# Patient Record
Sex: Female | Born: 1995
Health system: Southern US, Community
[De-identification: ages and names within clinical notes are randomized; demographics above are authoritative.]

## PROBLEM LIST (undated history)

## (undated) ENCOUNTER — Inpatient Hospital Stay (HOSPITAL_COMMUNITY): Payer: Self-pay

---
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
## 2014-06-02 ENCOUNTER — Ambulatory Visit: Payer: Self-pay | Admitting: Internal Medicine

## 2015-05-16 ENCOUNTER — Ambulatory Visit (INDEPENDENT_AMBULATORY_CARE_PROVIDER_SITE_OTHER): Payer: 59 | Admitting: Family Medicine

## 2015-05-16 VITALS — BP 102/60 | HR 71 | Temp 98.1°F | Resp 17 | Ht 67.0 in | Wt 149.0 lb

## 2015-05-16 DIAGNOSIS — N898 Other specified noninflammatory disorders of vagina: Secondary | ICD-10-CM | POA: Diagnosis not present

## 2015-05-16 DIAGNOSIS — N3090 Cystitis, unspecified without hematuria: Secondary | ICD-10-CM

## 2015-05-16 DIAGNOSIS — R300 Dysuria: Secondary | ICD-10-CM | POA: Diagnosis not present

## 2015-05-16 DIAGNOSIS — Z113 Encounter for screening for infections with a predominantly sexual mode of transmission: Secondary | ICD-10-CM | POA: Diagnosis not present

## 2015-05-16 LAB — POCT WET + KOH PREP
Trich by wet prep: ABSENT
YEAST BY KOH: ABSENT
Yeast by wet prep: ABSENT

## 2015-05-16 LAB — POCT URINALYSIS DIP (MANUAL ENTRY)
BILIRUBIN UA: NEGATIVE
GLUCOSE UA: NEGATIVE
Ketones, POC UA: NEGATIVE
NITRITE UA: NEGATIVE
Protein Ur, POC: NEGATIVE
SPEC GRAV UA: 1.02
UROBILINOGEN UA: 0.2
pH, UA: 7

## 2015-05-16 LAB — POC MICROSCOPIC URINALYSIS (UMFC): MUCUS RE: ABSENT

## 2015-05-16 LAB — HIV ANTIBODY (ROUTINE TESTING W REFLEX): HIV: NONREACTIVE

## 2015-05-16 MED ORDER — NITROFURANTOIN MONOHYD MACRO 100 MG PO CAPS: 100.0000 mg | ORAL_CAPSULE | Freq: Two times a day (BID) | ORAL | Status: DC

## 2015-05-16 NOTE — Progress Notes (Signed)
Subjective:    Patient ID: Ashley Fletcher, female    DOB: [birth year], 20 y.o.   MRN: [id number]  HPI This is a pleasant 20 yo female who presents today with 8 month history of intermittent vaginal irritation. She has not had any discharge or noticed any rash. She is sexually active with her boyfriend of 1 year. Her symptoms started before they became sexually active. [place] lives in [place] and they see each other rarely. She used Plan B following intercourse. She expects that she and her boyfriend will get engaged in the coming months and she will probably go on OCPs at that time. She is Muslim and it is against her religion to have sex outside of marriage. Her boyfriend is Muslim as well. She is from the [place] originally and was there visiting last spring. She though she had a UTI and saw a doctor there who prescribed her medication. She had relief of urinary symptoms, but continued to have vaginal irritation.   No past medical history on file. No past surgical history on file. Family History  Problem Relation Age of Onset  . Hypertension Mother   . Diabetes Father   . Hyperlipidemia Father   . Hypertension Father    Social History  Substance Use Topics  . Smoking status: Never Smoker   . Smokeless tobacco: None  . Alcohol Use: No    Review of Systems Per HPI    Objective:   Physical Exam  Constitutional: She is oriented to person, place, and time. She appears well-developed and well-nourished.  HENT:  Head: Normocephalic and atraumatic.  Eyes: Conjunctivae are normal.  Cardiovascular: Normal rate.   Pulmonary/Chest: Effort normal.  Genitourinary: Vagina normal. No vaginal discharge found.  Musculoskeletal: Normal range of motion.  Neurological: She is alert and oriented to person, place, and time.  Skin: Skin is warm and dry.  Psychiatric: She has a normal mood and affect. Her behavior is normal. Judgment and thought content normal.  Vitals reviewed. BP 102/60 mmHg  Pulse 71   Temp(Src) 98.1 F (36.7 C) (Oral)  Resp 17  Ht  (1.702 m)  Wt 149 lb (67.586 kg)  BMI 23.33 kg/m2  SpO2 95%  LMP 05/09/2015 Results for orders placed or performed in visit on 05/16/15  POCT urinalysis dipstick  Result Value Ref Range   Color, UA yellow yellow   Clarity, UA clear clear   Glucose, UA negative negative   Bilirubin, UA negative negative   Ketones, POC UA negative negative   Spec Grav, UA 1.020    Blood, UA trace-intact (A) negative   pH, UA 7.0    Protein Ur, POC negative negative   Urobilinogen, UA 0.2    Nitrite, UA Negative Negative   Leukocytes, UA Trace (A) Negative  POCT Microscopic Urinalysis ([facility])  Result Value Ref Range   WBC,UR,HPF,POC Few (A) None WBC/hpf   RBC,UR,HPF,POC None None RBC/hpf   Bacteria Moderate (A) None, Too numerous to count   Mucus Absent Absent   Epithelial Cells, UR Per Microscopy Moderate (A) None, Too numerous to count cells/hpf  POCT Wet + KOH Prep  Result Value Ref Range   Yeast by KOH Absent Present, Absent   Yeast by wet prep Absent Present, Absent   WBC by wet prep Few None, Few, Too numerous to count   Clue Cells Wet Prep HPF POC None None, Too numerous to count   Trich by wet prep Absent Present, Absent   Bacteria Wet Prep HPF  POC Few None, Few, Too numerous to count   Epithelial Cells By [employer] Pref ([facility]) Moderate (A) None, Few, Too numerous to count   RBC,UR,HPF,POC None None RBC/hpf       Assessment & Plan:  1. Dysuria - POCT urinalysis dipstick - POCT Microscopic Urinalysis ([facility])  2. Vaginal irritation - POCT Wet + KOH Prep  3. Screening for STDs (sexually transmitted diseases) - GC/Chlamydia Probe Amp - HIV antibody - RPR  4. Cystitis - nitrofurantoin, macrocrystal-monohydrate, (MACROBID) 100 MG capsule; Take 1 capsule (100 mg total) by mouth 2 (two) times daily.  Dispense: 14 capsule; Refill: 0   Olean Ree, FNP-BC  [facility]  05/16/2015  2:26 PM

## 2015-05-16 NOTE — Progress Notes (Signed)
   Subjective:    Patient ID: Ashley Fletcher, female    DOB: 03-04-1996, 20 y.o.   MRN: [id number]  HPI This is a pleasant 20 years old that presents today with itching and burning to her vaginal region since last Ashley Fletcher . Pt reports that last Ashley Fletcher she was in [place] and believed she had a UTI (burning and itching). She went to see a doctor who gave her some medication that relieved the burning of her urethra but not her vagina. Patient states that she was sexual active last week and felt a burning sensation when she went to urinate. She states that since last Wednesday (2/22) urinating has been very painful. She tried Azo last September- no relief. Also reports that these symptoms are intermittent. Also reports that she tried changing her clothing, detergent, and underwear with no relief. Denies blood, discharge,or foul smell.    No past medical history on file. Family History  Problem Relation Age of Onset  . Hypertension Mother   . Diabetes Father   . Hyperlipidemia Father   . Hypertension Father    Social History   Social History  . Marital Status: Single    Spouse Name: N/A  . Number of Children: N/A  . Years of Education: N/A   Occupational History  . Not on file.   Social History Main Topics  . Smoking status: Never Smoker   . Smokeless tobacco: Not on file  . Alcohol Use: No  . Drug Use: No  . Sexual Activity: Not on file   Other Topics Concern  . Not on file   Social History Narrative     Review of Systems  Constitutional: Negative for activity change and appetite change.  HENT: Negative for rhinorrhea, sinus pressure and sneezing.   Respiratory: Negative for cough, chest tightness and shortness of breath.   Cardiovascular: Negative for chest pain.  Gastrointestinal: Negative for nausea, vomiting, abdominal pain and diarrhea.  Genitourinary: Positive for dysuria and dyspareunia. Negative for urgency, hematuria, flank pain and vaginal bleeding.  Musculoskeletal: Negative  for back pain.  Skin: Negative for rash.       Objective:   Physical Exam  Constitutional: She is oriented to person, place, and time. She appears well-developed and well-nourished.  HENT:  Head: Normocephalic.  Genitourinary: No vaginal discharge found.  Pink in color, no foul smell  Musculoskeletal: Normal range of motion.  Neurological: She is alert and oriented to person, place, and time.  Skin: Skin is warm and dry. No rash noted.  Psychiatric: She has a normal mood and affect. Her behavior is normal. Judgment and thought content normal.    BP 102/60 mmHg  Pulse 71  Temp(Src) 98.1 F (36.7 C) (Oral)  Resp 17  Ht  (1.702 m)  Wt 149 lb (67.586 kg)  BMI 23.33 kg/m2  SpO2 95%  LMP 05/09/2015       Assessment & Plan:

## 2015-05-16 NOTE — Patient Instructions (Signed)

## 2015-05-17 LAB — RPR

## 2015-05-17 LAB — GC/CHLAMYDIA PROBE AMP
CT PROBE, AMP APTIMA: NOT DETECTED
GC Probe RNA: NOT DETECTED

## 2015-07-05 ENCOUNTER — Encounter: Payer: Self-pay | Admitting: Family Medicine

## 2015-08-20 ENCOUNTER — Ambulatory Visit: Payer: Self-pay | Attending: Internal Medicine

## 2017-12-03 ENCOUNTER — Ambulatory Visit: Payer: Self-pay | Attending: Family Medicine | Admitting: Family Medicine

## 2017-12-03 ENCOUNTER — Other Ambulatory Visit: Payer: Self-pay

## 2017-12-03 ENCOUNTER — Encounter: Payer: Self-pay | Admitting: Family Medicine

## 2017-12-03 VITALS — BP 131/88 | HR 76 | Temp 98.8°F | Resp 18 | Ht 67.0 in | Wt 165.4 lb

## 2017-12-03 DIAGNOSIS — R072 Precordial pain: Secondary | ICD-10-CM

## 2017-12-03 DIAGNOSIS — R300 Dysuria: Secondary | ICD-10-CM

## 2017-12-03 DIAGNOSIS — R82998 Other abnormal findings in urine: Secondary | ICD-10-CM

## 2017-12-03 DIAGNOSIS — R0789 Other chest pain: Secondary | ICD-10-CM | POA: Insufficient documentation

## 2017-12-03 DIAGNOSIS — R5383 Other fatigue: Secondary | ICD-10-CM

## 2017-12-03 LAB — POCT URINALYSIS DIP (CLINITEK)
Bilirubin, UA: NEGATIVE
Glucose, UA: NEGATIVE mg/dL
Ketones, POC UA: NEGATIVE mg/dL
Nitrite, UA: NEGATIVE
POC,PROTEIN,UA: NEGATIVE
Spec Grav, UA: 1.015
Urobilinogen, UA: 0.2 U/dL
pH, UA: 7

## 2017-12-03 MED ORDER — OMEPRAZOLE 20 MG PO CPDR: 20.0000 mg | DELAYED_RELEASE_CAPSULE | Freq: Every day | ORAL | 3 refills | Status: DC

## 2017-12-03 NOTE — Progress Notes (Signed)
Flu shot: no  Pain: 0

## 2017-12-04 LAB — CBC WITH DIFFERENTIAL/PLATELET
Basophils Absolute: 0 x10E3/uL (ref 0.0–0.2)
Basos: 1 %
EOS (ABSOLUTE): 0.2 x10E3/uL (ref 0.0–0.4)
Eos: 3 %
Hematocrit: 37.9 % (ref 34.0–46.6)
Hemoglobin: 12.3 g/dL (ref 11.1–15.9)
Immature Grans (Abs): 0 x10E3/uL (ref 0.0–0.1)
Immature Granulocytes: 0 %
Lymphocytes Absolute: 2.4 x10E3/uL (ref 0.7–3.1)
Lymphs: 36 %
MCH: 26.2 pg — ABNORMAL LOW (ref 26.6–33.0)
MCHC: 32.5 g/dL (ref 31.5–35.7)
MCV: 81 fL (ref 79–97)
Monocytes Absolute: 0.7 x10E3/uL (ref 0.1–0.9)
Monocytes: 10 %
Neutrophils Absolute: 3.3 x10E3/uL (ref 1.4–7.0)
Neutrophils: 50 %
Platelets: 304 x10E3/uL (ref 150–450)
RBC: 4.7 x10E6/uL (ref 3.77–5.28)
RDW: 14.8 % (ref 12.3–15.4)
WBC: 6.6 x10E3/uL (ref 3.4–10.8)

## 2017-12-04 LAB — BASIC METABOLIC PANEL WITH GFR
BUN/Creatinine Ratio: 11 (ref 9–23)
BUN: 8 mg/dL (ref 6–20)
CO2: 24 mmol/L (ref 20–29)
Calcium: 9.5 mg/dL (ref 8.7–10.2)
Chloride: 101 mmol/L (ref 96–106)
Creatinine, Ser: 0.76 mg/dL (ref 0.57–1.00)
GFR calc Af Amer: 128 mL/min/1.73
GFR calc non Af Amer: 111 mL/min/1.73
Glucose: 86 mg/dL (ref 65–99)
Potassium: 3.8 mmol/L (ref 3.5–5.2)
Sodium: 140 mmol/L (ref 134–144)

## 2017-12-04 LAB — TSH: TSH: 1.6 u[IU]/mL (ref 0.450–4.500)

## 2017-12-04 MED FILL — OMEPRAZOLE 20 MG CAP: 20 | 30 days supply | Qty: 30 | Fill #0

## 2017-12-05 LAB — URINE CULTURE

## 2017-12-06 NOTE — Progress Notes (Signed)
Subjective:    Patient ID: Ashley Fletcher, female    DOB: [birth year], 22 y.o.   MRN: [id number]  HPI 22 year old female who is new to the practice and presents to establish care.  Patient states that she is currently on her menses but she has noticed that she has some burning/discomfort with urination and believes she Katreena have a urinary tract infection.  Patient denies any fever or chills.  Patient is sexually active.  Patient denies any pelvic, abdominal vaginal pain and has seen no vaginal discharge.  Patient also with complaint of noticing a recent increase in fatigue.  Patient believes that the fatigue has increased over the past several months.  Patient reports no significant past medical history.  Patient reports family history of mother with hypertension and father with hypertension, hyperlipidemia and diabetes.  Patient has never smoked cigarettes and does not drink alcohol.  Patient denies any prior surgeries.    Review of Systems  Constitutional: Positive for fatigue. Negative for chills and fever.  Respiratory: Negative for cough and shortness of breath.   Cardiovascular: Positive for chest pain (Patient gets occasional discomfort/burning sensation in the upper middle portion of her chest). Negative for palpitations and leg swelling.  Gastrointestinal: Positive for nausea (Occasional mild nausea, burping/belching and bad tasting liquid in her mouth/bad breath). Negative for abdominal pain.  Genitourinary: Positive for dysuria. Negative for frequency.  Musculoskeletal: Negative for back pain, gait problem and joint swelling.  Neurological: Negative for dizziness and headaches.       Objective:   Physical Exam BP 131/88   Pulse 76   Temp 98.8 F (37.1 C) (Oral)   Resp 18   Ht 5\' 7"  (1.702 m)   Wt 165 lb 6.4 oz (75 kg)   LMP 12/03/2017   SpO2 99%   BMI 25.91 kg/m  Vital signs and nurse's note reviewed General-well-nourished, well-developed young adult female in no acute  distress Neck-supple, no lymphadenopathy, no thyromegaly Lungs-clear to auscultation bilaterally Cardiovascular-regular rate and rhythm Abdomen-soft no reproducible epigastric or suprapubic tenderness on exam Back-no CVA tenderness Extremities-no edema      Assessment & Plan:  1. Substernal chest pain On review of systems, complaint occasional substernal chest pain as well as often having a bad taste in the mouth in the mornings and patient has also had burping/belching, occasional nausea and occasional backwash of bad tasting fluid into her mouth.  Discussed with the patient that her symptoms are consistent with gastroesophageal reflux.  Patient will be placed on omeprazole.  Patient should avoid late night eating as well as avoidance of trigger foods.  Patient should return to clinic if she has continued episodes of chest discomfort or any concerns.  Patient will also have CBC.  Due to patient's age and lack of any other symptoms associated with her substernal chest pain such as diaphoresis or radiation of pain to the back, neck or left arm, there is a low probability that chest is cardiac in nature and therefore no EKG done at today's visit - CBC with Differential - omeprazole (PRILOSEC) 20 MG capsule; Take 1 capsule (20 mg total) by mouth daily.  Dispense: 30 capsule; Refill: 3  2. Dysuria Patient with complaint of dysuria and urinalysis was done to look for possible urinary tract infection - POCT URINALYSIS DIP (CLINITEK)  3. Fatigue, unspecified type Patient with complaint of recent onset of fatigue.  Pregnancy test was not done as patient is currently on her regular menstrual cycle which most likely indicates  that she is not currently pregnant.  Patient will have CBC, BMP and TSH to look for anemia, electrolyte abnormality/elevated glucose or thyroid disorder in follow-up of her fatigue as well as urinalysis to look for possible urinary tract infection as patient also has dysuria. - POCT  URINALYSIS DIP (CLINITEK) - CBC with Differential - Basic Metabolic Panel - TSH  4. Leukocytes in urine Patient with leukocytes in the urine and urine will be sent for culture.  Patient will be notified if further treatment is needed based on culture results. - Urine Culture  *Influenza immunization offered but declined at today's visit  An After Visit Summary was printed and given to the patient.  Return in about 6 weeks (around 01/14/2018).

## 2017-12-07 ENCOUNTER — Telehealth: Payer: Self-pay

## 2017-12-07 NOTE — Telephone Encounter (Signed)
Contacted pt to go over lab and urine results pt is aware  Dr. Jillyn HiddenFulp pt is wanting to know since there is no treatment needed what does she need to do for the symptoms. Pt states she informed you that her symptoms starts after her period

## 2017-12-09 NOTE — Telephone Encounter (Signed)
Pt is aware of Dr. Jillyn HiddenFulp response pt doesn't have any questions or concerns

## 2018-01-14 ENCOUNTER — Ambulatory Visit: Payer: Self-pay | Admitting: Family Medicine

## 2018-02-16 ENCOUNTER — Ambulatory Visit: Payer: Self-pay | Admitting: Family Medicine

## 2018-12-06 DIAGNOSIS — Z118 Encounter for screening for other infectious and parasitic diseases: Secondary | ICD-10-CM | POA: Diagnosis not present

## 2018-12-06 DIAGNOSIS — N760 Acute vaginitis: Secondary | ICD-10-CM | POA: Diagnosis not present

## 2018-12-06 DIAGNOSIS — Z113 Encounter for screening for infections with a predominantly sexual mode of transmission: Secondary | ICD-10-CM | POA: Diagnosis not present

## 2018-12-22 DIAGNOSIS — Z01419 Encounter for gynecological examination (general) (routine) without abnormal findings: Secondary | ICD-10-CM | POA: Diagnosis not present

## 2018-12-22 DIAGNOSIS — Z6825 Body mass index (BMI) 25.0-25.9, adult: Secondary | ICD-10-CM | POA: Diagnosis not present

## 2019-01-20 ENCOUNTER — Other Ambulatory Visit: Payer: Self-pay

## 2019-01-20 DIAGNOSIS — B373 Candidiasis of vulva and vagina: Secondary | ICD-10-CM | POA: Diagnosis not present

## 2019-01-20 DIAGNOSIS — Z20822 Contact with and (suspected) exposure to covid-19: Secondary | ICD-10-CM

## 2019-01-20 DIAGNOSIS — N898 Other specified noninflammatory disorders of vagina: Secondary | ICD-10-CM | POA: Diagnosis not present

## 2019-01-20 DIAGNOSIS — R300 Dysuria: Secondary | ICD-10-CM | POA: Diagnosis not present

## 2019-01-22 LAB — NOVEL CORONAVIRUS, NAA: SARS-CoV-2, NAA: NOT DETECTED

## 2019-02-09 ENCOUNTER — Other Ambulatory Visit: Payer: Self-pay

## 2019-02-09 DIAGNOSIS — Z20822 Contact with and (suspected) exposure to covid-19: Secondary | ICD-10-CM

## 2019-02-10 ENCOUNTER — Other Ambulatory Visit: Payer: Self-pay

## 2019-02-10 ENCOUNTER — Emergency Department (HOSPITAL_COMMUNITY): Payer: BC Managed Care – PPO

## 2019-02-10 ENCOUNTER — Emergency Department (HOSPITAL_COMMUNITY)
Admission: EM | Admit: 2019-02-10 | Discharge: 2019-02-10 | Disposition: A | Discharge status: Home / Self Care | Payer: BC Managed Care – PPO | Attending: Emergency Medicine | Admitting: Emergency Medicine

## 2019-02-10 ENCOUNTER — Encounter (HOSPITAL_COMMUNITY): Payer: Self-pay | Admitting: Emergency Medicine

## 2019-02-10 DIAGNOSIS — U071 COVID-19: Secondary | ICD-10-CM | POA: Insufficient documentation

## 2019-02-10 DIAGNOSIS — Z79899 Other long term (current) drug therapy: Secondary | ICD-10-CM | POA: Insufficient documentation

## 2019-02-10 DIAGNOSIS — R0602 Shortness of breath: Secondary | ICD-10-CM | POA: Diagnosis not present

## 2019-02-10 DIAGNOSIS — R079 Chest pain, unspecified: Secondary | ICD-10-CM | POA: Diagnosis not present

## 2019-02-10 LAB — URINALYSIS, ROUTINE W REFLEX MICROSCOPIC
Bacteria, UA: NONE SEEN
Bilirubin Urine: NEGATIVE
Glucose, UA: NEGATIVE mg/dL
Ketones, ur: NEGATIVE mg/dL
Leukocytes,Ua: NEGATIVE
Nitrite: NEGATIVE
Protein, ur: NEGATIVE mg/dL
Specific Gravity, Urine: 1.004 — ABNORMAL LOW (ref 1.005–1.030)
pH: 6 (ref 5.0–8.0)

## 2019-02-10 LAB — CBC
HCT: 36 % (ref 36.0–46.0)
Hemoglobin: 11.5 g/dL — ABNORMAL LOW (ref 12.0–15.0)
MCH: 26.6 pg (ref 26.0–34.0)
MCHC: 31.9 g/dL (ref 30.0–36.0)
MCV: 83.1 fL (ref 80.0–100.0)
Platelets: 192 10*3/uL (ref 150–400)
RBC: 4.33 MIL/uL (ref 3.87–5.11)
RDW: 15.8 % — ABNORMAL HIGH (ref 11.5–15.5)
WBC: 3.1 10*3/uL — ABNORMAL LOW (ref 4.0–10.5)
nRBC: 0 % (ref 0.0–0.2)

## 2019-02-10 LAB — COMPREHENSIVE METABOLIC PANEL
ALT: 23 U/L (ref 0–44)
AST: 19 U/L (ref 15–41)
Albumin: 3.4 g/dL — ABNORMAL LOW (ref 3.5–5.0)
Alkaline Phosphatase: 64 U/L (ref 38–126)
Anion gap: 7 (ref 5–15)
BUN: 12 mg/dL (ref 6–20)
CO2: 21 mmol/L — ABNORMAL LOW (ref 22–32)
Calcium: 8.7 mg/dL — ABNORMAL LOW (ref 8.9–10.3)
Chloride: 110 mmol/L (ref 98–111)
Creatinine, Ser: 0.57 mg/dL (ref 0.44–1.00)
GFR calc Af Amer: >60 mL/min (ref >60)
GFR calc non Af Amer: >60 mL/min (ref >60)
Glucose, Bld: 94 mg/dL (ref 70–99)
Potassium: 3.5 mmol/L (ref 3.5–5.1)
Sodium: 138 mmol/L (ref 135–145)
Total Bilirubin: 0.3 mg/dL (ref 0.3–1.2)
Total Protein: 6.8 g/dL (ref 6.5–8.1)

## 2019-02-10 LAB — POC SARS CORONAVIRUS 2 AG -  ED: SARS Coronavirus 2 Ag: POSITIVE — AB

## 2019-02-10 LAB — TROPONIN I (HIGH SENSITIVITY): Troponin I (High Sensitivity): 2 ng/L (ref <18)

## 2019-02-10 LAB — I-STAT BETA HCG BLOOD, ED (MC, WL, AP ONLY): I-stat hCG, quantitative: <5 m[IU]/mL (ref <5)

## 2019-02-10 LAB — NOVEL CORONAVIRUS, NAA: SARS-CoV-2, NAA: DETECTED — AB

## 2019-02-10 LAB — LIPASE, BLOOD: Lipase: 30 U/L (ref 11–51)

## 2019-02-10 MED ORDER — SODIUM CHLORIDE 0.9% FLUSH: 3.0000 mL | Freq: Once | INTRAVENOUS | Status: DC

## 2019-02-10 MED ORDER — SODIUM CHLORIDE 0.9% FLUSH
3.0000 mL | Freq: Once | INTRAVENOUS | Status: AC
  Administered 2019-02-10: 08:00:00 3 mL via INTRAVENOUS

## 2019-02-10 MED ORDER — HYDROCODONE-ACETAMINOPHEN 5-325 MG PO TABS
1.0000 | ORAL_TABLET | Freq: Once | ORAL | Status: AC
  Administered 2019-02-10: 1 via ORAL
  Filled 2019-02-10: qty 1

## 2019-02-10 NOTE — Discharge Instructions (Addendum)
Tylenol for aches. Warm compresses help muscle aching too.   Please return to the emergency department with any significant pain or shortness of breath.
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
## 2019-02-10 NOTE — ED Notes (Signed)
Pt verbalizes understanding of DC instructions. Pt belongings returned and is ambulatory out of ED.  

## 2019-02-10 NOTE — ED Triage Notes (Signed)
Patient complaining of mid chest pain and upper abdominal pain. Patient states this started around an hour ago. Patient is complaining of headache and fatigue.

## 2019-03-08 DIAGNOSIS — Z3200 Encounter for pregnancy test, result unknown: Secondary | ICD-10-CM | POA: Diagnosis not present

## 2019-03-18 NOTE — L&D Delivery Note (Signed)
Delivery Note:   G1P0 at [redacted]w[redacted]d  Admitting diagnosis: Normal labor [O80, Z37.9] Risks: None Onset of labor: 02/03/2020 @ 1625 IOL/Augmentation: Pitocin ROM: 02/03/2020 @ 0200, clear fluid  Complete dilation at 02/03/2020  2310 Onset of pushing at 2310 FHR second stage Cat I  Analgesia /Anesthesia intrapartum:Epidural  Pushing in lithotomy and side lying position with CNM and L&D staff support, Mustafa, FOB, and mother, Deatra James, present for birth and supportive.  Delivery of a Live born female  Birth Weight:  3015g, 6lb 10.4oz APGAR: 7, 9  Newborn Delivery   Birth date/time: 02/03/2020 23:42:00 Delivery type: Vaginal, Spontaneous     in cephalic presentation, position OA to LOA.  APGAR:1 min-7 , 5 min-9   Nuchal Cord: Yes  Cord double clamped after cessation of pulsation, cut by CNM.  Collection of cord blood for typing completed. Cord blood donation-None  Arterial cord blood sample-No    Placenta delivered-Spontaneous  with 3 vessels . Uterotonics: Pitocin Placenta to L&D Uterine tone firm  Bleeding stable  3rd degree  laceration identified.  Dr. Conni Elliot called to the bedside for assessment and repair of third degree laceration.  Episiotomy:None   Repair: See note by Dr. Ines Bloomer. Blood Loss (mL):150.00   Complications: None  Mom to postpartum.  Baby [name] to Couplet care / Skin to Skin.  Delivery Report:   Review the Delivery Report for details.    June Leap, CNM, MSN 02/04/2020, 1:22 AM

## 2019-04-05 DIAGNOSIS — G43109 Migraine with aura, not intractable, without status migrainosus: Secondary | ICD-10-CM | POA: Diagnosis not present

## 2019-04-05 DIAGNOSIS — R110 Nausea: Secondary | ICD-10-CM | POA: Diagnosis not present

## 2019-04-08 DIAGNOSIS — G44209 Tension-type headache, unspecified, not intractable: Secondary | ICD-10-CM | POA: Diagnosis not present

## 2019-04-08 DIAGNOSIS — R0981 Nasal congestion: Secondary | ICD-10-CM | POA: Diagnosis not present

## 2019-04-08 DIAGNOSIS — R05 Cough: Secondary | ICD-10-CM | POA: Diagnosis not present

## 2019-04-08 DIAGNOSIS — R0602 Shortness of breath: Secondary | ICD-10-CM | POA: Diagnosis not present

## 2019-04-10 DIAGNOSIS — R0602 Shortness of breath: Secondary | ICD-10-CM | POA: Diagnosis not present

## 2019-04-10 DIAGNOSIS — R05 Cough: Secondary | ICD-10-CM | POA: Diagnosis not present

## 2019-04-10 DIAGNOSIS — R0981 Nasal congestion: Secondary | ICD-10-CM | POA: Diagnosis not present

## 2019-04-10 DIAGNOSIS — G44209 Tension-type headache, unspecified, not intractable: Secondary | ICD-10-CM | POA: Diagnosis not present

## 2019-04-12 DIAGNOSIS — R7989 Other specified abnormal findings of blood chemistry: Secondary | ICD-10-CM | POA: Diagnosis not present

## 2019-04-12 DIAGNOSIS — R05 Cough: Secondary | ICD-10-CM | POA: Diagnosis not present

## 2019-04-12 DIAGNOSIS — R0602 Shortness of breath: Secondary | ICD-10-CM | POA: Diagnosis not present

## 2019-05-10 DIAGNOSIS — R102 Pelvic and perineal pain: Secondary | ICD-10-CM | POA: Diagnosis not present

## 2019-05-10 DIAGNOSIS — N760 Acute vaginitis: Secondary | ICD-10-CM | POA: Diagnosis not present

## 2019-05-24 DIAGNOSIS — Z3689 Encounter for other specified antenatal screening: Secondary | ICD-10-CM | POA: Diagnosis not present

## 2019-05-24 DIAGNOSIS — Z3200 Encounter for pregnancy test, result unknown: Secondary | ICD-10-CM | POA: Diagnosis not present

## 2019-05-30 ENCOUNTER — Other Ambulatory Visit: Payer: Self-pay

## 2019-05-30 ENCOUNTER — Encounter (HOSPITAL_COMMUNITY): Payer: Self-pay | Admitting: Emergency Medicine

## 2019-05-30 ENCOUNTER — Emergency Department (HOSPITAL_COMMUNITY)
Admission: EM | Admit: 2019-05-30 | Discharge: 2019-05-31 | Disposition: A | Discharge status: Home / Self Care | Payer: BC Managed Care – PPO | Attending: Emergency Medicine | Admitting: Emergency Medicine

## 2019-05-30 DIAGNOSIS — Y9389 Activity, other specified: Secondary | ICD-10-CM | POA: Diagnosis not present

## 2019-05-30 DIAGNOSIS — Y999 Unspecified external cause status: Secondary | ICD-10-CM | POA: Diagnosis not present

## 2019-05-30 DIAGNOSIS — I10 Essential (primary) hypertension: Secondary | ICD-10-CM | POA: Diagnosis not present

## 2019-05-30 DIAGNOSIS — O9A211 Injury, poisoning and certain other consequences of external causes complicating pregnancy, first trimester: Secondary | ICD-10-CM | POA: Insufficient documentation

## 2019-05-30 DIAGNOSIS — M25551 Pain in right hip: Secondary | ICD-10-CM | POA: Diagnosis not present

## 2019-05-30 DIAGNOSIS — S0990XA Unspecified injury of head, initial encounter: Secondary | ICD-10-CM | POA: Insufficient documentation

## 2019-05-30 DIAGNOSIS — Y92410 Unspecified street and highway as the place of occurrence of the external cause: Secondary | ICD-10-CM | POA: Diagnosis not present

## 2019-05-30 DIAGNOSIS — R1030 Lower abdominal pain, unspecified: Secondary | ICD-10-CM

## 2019-05-30 DIAGNOSIS — Z3A01 Less than 8 weeks gestation of pregnancy: Secondary | ICD-10-CM | POA: Insufficient documentation

## 2019-05-30 DIAGNOSIS — R519 Headache, unspecified: Secondary | ICD-10-CM | POA: Insufficient documentation

## 2019-05-30 DIAGNOSIS — R1084 Generalized abdominal pain: Secondary | ICD-10-CM | POA: Diagnosis not present
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
## 2019-05-31 ENCOUNTER — Other Ambulatory Visit: Payer: Self-pay

## 2019-05-31 ENCOUNTER — Emergency Department (HOSPITAL_COMMUNITY): Payer: BC Managed Care – PPO

## 2019-05-31 DIAGNOSIS — R519 Headache, unspecified: Secondary | ICD-10-CM | POA: Diagnosis not present

## 2019-05-31 DIAGNOSIS — S0990XA Unspecified injury of head, initial encounter: Secondary | ICD-10-CM | POA: Diagnosis not present
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
## 2019-05-31 NOTE — ED Notes (Signed)
Pt went to restroom, did not give urine sample, EDP Jeraldine Loots okay
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
## 2019-06-01 DIAGNOSIS — M791 Myalgia, unspecified site: Secondary | ICD-10-CM | POA: Diagnosis not present

## 2019-06-01 DIAGNOSIS — G44209 Tension-type headache, unspecified, not intractable: Secondary | ICD-10-CM | POA: Diagnosis not present

## 2019-06-07 DIAGNOSIS — R109 Unspecified abdominal pain: Secondary | ICD-10-CM | POA: Diagnosis not present

## 2019-06-22 DIAGNOSIS — Z3201 Encounter for pregnancy test, result positive: Secondary | ICD-10-CM | POA: Diagnosis not present

## 2019-06-28 DIAGNOSIS — Z3401 Encounter for supervision of normal first pregnancy, first trimester: Secondary | ICD-10-CM | POA: Diagnosis not present

## 2019-06-28 DIAGNOSIS — Z789 Other specified health status: Secondary | ICD-10-CM | POA: Diagnosis not present

## 2019-06-28 DIAGNOSIS — Z3689 Encounter for other specified antenatal screening: Secondary | ICD-10-CM | POA: Diagnosis not present

## 2019-07-12 DIAGNOSIS — Z3401 Encounter for supervision of normal first pregnancy, first trimester: Secondary | ICD-10-CM | POA: Diagnosis not present

## 2019-07-12 DIAGNOSIS — Z113 Encounter for screening for infections with a predominantly sexual mode of transmission: Secondary | ICD-10-CM | POA: Diagnosis not present

## 2019-08-09 DIAGNOSIS — Z3402 Encounter for supervision of normal first pregnancy, second trimester: Secondary | ICD-10-CM | POA: Diagnosis not present

## 2019-09-23 DIAGNOSIS — Z3A20 20 weeks gestation of pregnancy: Secondary | ICD-10-CM | POA: Diagnosis not present

## 2019-09-23 DIAGNOSIS — O353XX0 Maternal care for (suspected) damage to fetus from viral disease in mother, not applicable or unspecified: Secondary | ICD-10-CM | POA: Diagnosis not present

## 2019-11-18 DIAGNOSIS — Z3689 Encounter for other specified antenatal screening: Secondary | ICD-10-CM | POA: Diagnosis not present

## 2019-11-29 DIAGNOSIS — O3663X0 Maternal care for excessive fetal growth, third trimester, not applicable or unspecified: Secondary | ICD-10-CM | POA: Diagnosis not present

## 2019-11-29 DIAGNOSIS — Z3A31 31 weeks gestation of pregnancy: Secondary | ICD-10-CM | POA: Diagnosis not present

## 2019-12-08 ENCOUNTER — Encounter (HOSPITAL_COMMUNITY): Payer: Self-pay | Admitting: Obstetrics and Gynecology

## 2019-12-08 ENCOUNTER — Other Ambulatory Visit: Payer: Self-pay

## 2019-12-08 ENCOUNTER — Inpatient Hospital Stay (HOSPITAL_COMMUNITY)
Admission: AD | Admit: 2019-12-08 | Discharge: 2019-12-09 | Disposition: A | Discharge status: Home / Self Care | Payer: BC Managed Care – PPO | Attending: Obstetrics | Admitting: Obstetrics

## 2019-12-08 DIAGNOSIS — R519 Headache, unspecified: Secondary | ICD-10-CM | POA: Insufficient documentation

## 2019-12-08 DIAGNOSIS — Z3A31 31 weeks gestation of pregnancy: Secondary | ICD-10-CM | POA: Insufficient documentation

## 2019-12-08 DIAGNOSIS — O26893 Other specified pregnancy related conditions, third trimester: Secondary | ICD-10-CM | POA: Insufficient documentation

## 2019-12-08 DIAGNOSIS — O4700 False labor before 37 completed weeks of gestation, unspecified trimester: Secondary | ICD-10-CM

## 2019-12-08 NOTE — MAU Note (Addendum)
Having abdominal cramps since last night. Felt like period cramps. Period cramps started back tonight and getting worse. Having pelvic pressure. Has headache tonight. When awoke today had "red dots around eyes and nose". Since Monday tongue is sore "like I have cuts all on it" Mostly came in because of abd cramping

## 2019-12-08 NOTE — MAU Provider Note (Addendum)
Chief Complaint:  Abdominal Pain and Headache   First Provider Initiated Contact with Patient 12/08/19 2359     HPI: Ashley Fletcher is a 24 y.o. G1P0 at 27w5dwho presents to maternity admissions reporting pelvic cramping.  Also c/o retroorbital headache. Has not taken anything. Has new small red dots around eyes/brow, no itching She reports good fetal movement, denies LOF, vaginal bleeding, vaginal itching/burning, urinary symptoms, h/a, dizziness, n/v, diarrhea, constipation or fever/chills.  She denies headache, visual changes or RUQ abdominal pain.  Abdominal Pain This is a new problem. The current episode started yesterday. The onset quality is gradual. The problem occurs intermittently. The problem has been unchanged. The pain is mild. The quality of the pain is cramping. The abdominal pain does not radiate. Associated symptoms include headaches. Pertinent negatives include no constipation, diarrhea, dysuria, fever or nausea. Nothing aggravates the pain. The pain is relieved by nothing. She has tried nothing for the symptoms.  Headache  This is a new problem. The current episode started today. The problem occurs constantly. The pain is located in the retro-orbital region. The pain does not radiate. The pain quality is not similar to prior headaches. The quality of the pain is described as aching. Associated symptoms include abdominal pain. Pertinent negatives include no coughing, dizziness, fever, muscle aches or nausea. Nothing aggravates the symptoms. She has tried nothing for the symptoms. The treatment provided no relief.    RN Note: Having abdominal cramps since last night. Felt like period cramps. Period cramps started back tonight and getting worse. Having pelvic pressure. Has headache tonight. When awoke today had "red dots around eyes and nose". Since Monday tongue is sore "like I have cuts all on it" Mostly came in because of abd cramping  Past Medical History: History reviewed. No  pertinent past medical history.  Past obstetric history: OB History  Gravida Para Term Preterm AB Living  1            SAB TAB Ectopic Multiple Live Births               # Outcome Date GA Lbr Len/2nd Weight Sex Delivery Anes PTL Lv  1 Current             Past Surgical History: History reviewed. No pertinent surgical history.  Family History: Family History  Problem Relation Age of Onset   Hypertension Mother    Diabetes Father    Hyperlipidemia Father    Hypertension Father     Social History: Social History   Tobacco Use   Smoking status: Never Smoker   Smokeless tobacco: Never Used  Building services engineer Use: Never used  Substance Use Topics   Alcohol use: No   Drug use: No    Allergies:  Allergies  Allergen Reactions   Peanut-Containing Drug Products Hives and Rash    Meds:  Medications Prior to Admission  Medication Sig Dispense Refill Last Dose   calcium carbonate (TUMS - DOSED IN MG ELEMENTAL CALCIUM) 500 MG chewable tablet Chew 1 tablet by mouth daily.   12/07/2019 at Unknown time   Prenatal Vit-Fe Fumarate-FA (PRENATAL MULTIVITAMIN) TABS tablet Take 1 tablet by mouth daily at 12 noon.   12/07/2019 at Unknown time    I have reviewed patient's Past Medical Hx, Surgical Hx, Family Hx, Social Hx, medications and allergies.   ROS:  Review of Systems  Constitutional: Negative for fever.  Respiratory: Negative for cough.   Gastrointestinal: Positive for abdominal pain. Negative for constipation,  diarrhea and nausea.  Genitourinary: Negative for dysuria.  Neurological: Positive for headaches. Negative for dizziness.   Other systems negative  Physical Exam   Patient Vitals for the past 24 hrs:  BP Temp Pulse Resp Height Weight  12/08/19 2348 111/75 -- 85 -- -- --  12/08/19 2328 131/65 -- 88 -- -- --  12/08/19 2325 -- 98.4 F (36.9 C) -- 20 5\' 6"  (1.676 m) 82.1 kg   Constitutional: Well-developed, well-nourished female in no acute  distress.  HEENT:  Several tiny red papules around eyes and between brows.  No erethema or scaling.  They almost appear to be cherry angiomas or possibly petechiae Cardiovascular: normal rate and rhythm Respiratory: normal effort, clear to auscultation bilaterally GI: Abd soft, non-tender, gravid appropriate for gestational age.   No rebound or guarding. MS: Extremities nontender, no edema, normal ROM Neurologic: Alert and oriented x 4.  GU: Neg CVAT.  PELVIC EXAM:  Dilation: Closed Effacement (%): Thick Station: Ballotable Exam by:: [email or web address], CNM   FHT:  Baseline 140 , moderate variability, accelerations present, no decelerations Contractions: q 3-4 mins Irregular  With some irritability.   Labs: Results for orders placed or performed during the hospital encounter of 12/08/19 (from the past 24 hour(s))  Fetal fibronectin     Status: None   Collection Time: 12/09/19 12:07 AM  Result Value Ref Range   Fetal Fibronectin NEGATIVE NEGATIVE  Urinalysis, Routine w reflex microscopic     Status: Abnormal   Collection Time: 12/09/19 12:08 AM  Result Value Ref Range   Color, Urine YELLOW YELLOW   APPearance CLEAR CLEAR   Specific Gravity, Urine <1.005 (L) 1.005 - 1.030   pH 6.5 5.0 - 8.0   Glucose, UA NEGATIVE NEGATIVE mg/dL   Hgb urine dipstick NEGATIVE NEGATIVE   Bilirubin Urine NEGATIVE NEGATIVE   Ketones, ur NEGATIVE NEGATIVE mg/dL   Protein, ur NEGATIVE NEGATIVE mg/dL   Nitrite NEGATIVE NEGATIVE   Leukocytes,Ua NEGATIVE NEGATIVE    Imaging:  No results found.  MAU Course/MDM: I have ordered labs and reviewed results. UA is clear and urine is dilute.  NST reviewed, reactive with intermittent contractions  Treatments in MAU included Procardia series which did lessen contractions.  We also gave her Fioricet for her headache which did help it resolve. Fetal fibronectin is negative  Assessment: Single IUP at [redacted]w[redacted]d Preterm uterine contractions with negative fetal  fibronectin Headache Scattered papules on face, unclear diagnosis  Plan: Discharge home Preterm Labor precautions and fetal kick counts Follow up in Office for prenatal visits   Encouraged to return here or to other Urgent Care/ED if she develops worsening of symptoms, increase in pain, fever, or other concerning symptoms.   Pt stable at time of discharge.  [redacted]w[redacted]d CNM, MSN Certified Nurse-Midwife 12/08/2019 11:59 PM

## 2019-12-09 DIAGNOSIS — R519 Headache, unspecified: Secondary | ICD-10-CM

## 2019-12-09 DIAGNOSIS — Z3A31 31 weeks gestation of pregnancy: Secondary | ICD-10-CM

## 2019-12-09 DIAGNOSIS — O4703 False labor before 37 completed weeks of gestation, third trimester: Secondary | ICD-10-CM

## 2019-12-09 DIAGNOSIS — O26893 Other specified pregnancy related conditions, third trimester: Secondary | ICD-10-CM

## 2019-12-09 LAB — URINALYSIS, ROUTINE W REFLEX MICROSCOPIC
Bilirubin Urine: NEGATIVE
Glucose, UA: NEGATIVE mg/dL
Hgb urine dipstick: NEGATIVE
Ketones, ur: NEGATIVE mg/dL
Leukocytes,Ua: NEGATIVE
Nitrite: NEGATIVE
Protein, ur: NEGATIVE mg/dL
Specific Gravity, Urine: <1.005 — ABNORMAL LOW (ref 1.005–1.030)
pH: 6.5 (ref 5.0–8.0)

## 2019-12-09 LAB — FETAL FIBRONECTIN: Fetal Fibronectin: NEGATIVE

## 2019-12-09 MED ORDER — NIFEDIPINE 10 MG PO CAPS
10.0000 mg | ORAL_CAPSULE | ORAL | Status: DC | PRN
  Administered 2019-12-09 (×3): 10 mg via ORAL
  Filled 2019-12-09 (×3): qty 1

## 2019-12-09 MED ORDER — BUTALBITAL-APAP-CAFFEINE 50-325-40 MG PO TABS
2.0000 | ORAL_TABLET | Freq: Once | ORAL | Status: AC
  Administered 2019-12-09: 2 via ORAL
  Filled 2019-12-09: qty 2

## 2019-12-09 NOTE — Discharge Instructions (Signed)
Preterm Labor and Birth Information ° °The normal length of a pregnancy is 39-41 weeks. Preterm labor is when labor starts before 37 completed weeks of pregnancy. °What are the risk factors for preterm labor? °Preterm labor is more likely to occur in women who: °· Have certain infections during pregnancy such as a bladder infection, sexually transmitted infection, or infection inside the uterus (chorioamnionitis). °· Have a shorter-than-normal cervix. °· Have gone into preterm labor before. °· Have had surgery on their cervix. °· Are younger than age 17 or older than age 35. °· Are African American. °· Are pregnant with twins or multiple babies (multiple gestation). °· Take street drugs or smoke while pregnant. °· Do not gain enough weight while pregnant. °· Became pregnant shortly after having been pregnant. °What are the symptoms of preterm labor? °Symptoms of preterm labor include: °· Cramps similar to those that can happen during a menstrual period. The cramps Radiah happen with diarrhea. °· Pain in the abdomen or lower back. °· Regular uterine contractions that Kinya feel like tightening of the abdomen. °· A feeling of increased pressure in the pelvis. °· Increased watery or bloody mucus discharge from the vagina. °· Water breaking (ruptured amniotic sac). °Why is it important to recognize signs of preterm labor? °It is important to recognize signs of preterm labor because babies who are born prematurely Tyishia not be fully developed. This can put them at an increased risk for: °· Long-term (chronic) heart and lung problems. °· Difficulty immediately after birth with regulating body systems, including blood sugar, body temperature, heart rate, and breathing rate. °· Bleeding in the brain. °· Cerebral palsy. °· Learning difficulties. °· Death. °These risks are highest for babies who are born before 34 weeks of pregnancy. °How is preterm labor treated? °Treatment depends on the length of your pregnancy, your condition,  and the health of your baby. It Tonimarie involve: °· Having a stitch (suture) placed in your cervix to prevent your cervix from opening too early (cerclage). °· Taking or being given medicines, such as: °? Hormone medicines. These Anica be given early in pregnancy to help support the pregnancy. °? Medicine to stop contractions. °? Medicines to help mature the baby’s lungs. These Amrita be prescribed if the risk of delivery is high. °? Medicines to prevent your baby from developing cerebral palsy. °If the labor happens before 34 weeks of pregnancy, you Eastyn need to stay in the hospital. °What should I do if I think I am in preterm labor? °If you think that you are going into preterm labor, call your health care provider right away. °How can I prevent preterm labor in future pregnancies? °To increase your chance of having a full-term pregnancy: °· Do not use any tobacco products, such as cigarettes, chewing tobacco, and e-cigarettes. If you need help quitting, ask your health care provider. °· Do not use street drugs or medicines that have not been prescribed to you during your pregnancy. °· Talk with your health care provider before taking any herbal supplements, even if you have been taking them regularly. °· Make sure you gain a healthy amount of weight during your pregnancy. °· Watch for infection. If you think that you might have an infection, get it checked right away. °· Make sure to tell your health care provider if you have gone into preterm labor before. °This information is not intended to replace advice given to you by your health care provider. Make sure you discuss any questions you have with your   health care provider. °Document Revised: 06/25/2018 Document Reviewed: 07/25/2015 °Elsevier Patient Education © 2020 Elsevier Inc. ° °

## 2019-12-12 DIAGNOSIS — O3663X0 Maternal care for excessive fetal growth, third trimester, not applicable or unspecified: Secondary | ICD-10-CM | POA: Diagnosis not present

## 2019-12-12 DIAGNOSIS — Z3A32 32 weeks gestation of pregnancy: Secondary | ICD-10-CM | POA: Diagnosis not present

## 2019-12-12 DIAGNOSIS — Z23 Encounter for immunization: Secondary | ICD-10-CM | POA: Diagnosis not present

## 2019-12-19 DIAGNOSIS — O3663X0 Maternal care for excessive fetal growth, third trimester, not applicable or unspecified: Secondary | ICD-10-CM | POA: Diagnosis not present

## 2019-12-19 DIAGNOSIS — Z3A33 33 weeks gestation of pregnancy: Secondary | ICD-10-CM | POA: Diagnosis not present

## 2019-12-28 DIAGNOSIS — Z3A34 34 weeks gestation of pregnancy: Secondary | ICD-10-CM | POA: Diagnosis not present

## 2019-12-28 DIAGNOSIS — O3663X0 Maternal care for excessive fetal growth, third trimester, not applicable or unspecified: Secondary | ICD-10-CM | POA: Diagnosis not present

## 2019-12-28 DIAGNOSIS — N898 Other specified noninflammatory disorders of vagina: Secondary | ICD-10-CM | POA: Diagnosis not present

## 2020-01-04 ENCOUNTER — Inpatient Hospital Stay (HOSPITAL_COMMUNITY)
Admission: AD | Admit: 2020-01-04 | Discharge: 2020-01-04 | Disposition: A | Discharge status: Home / Self Care | Payer: BC Managed Care – PPO | Attending: Obstetrics and Gynecology | Admitting: Obstetrics and Gynecology

## 2020-01-04 ENCOUNTER — Other Ambulatory Visit: Payer: Self-pay

## 2020-01-04 ENCOUNTER — Encounter (HOSPITAL_COMMUNITY): Payer: Self-pay | Admitting: Obstetrics and Gynecology

## 2020-01-04 DIAGNOSIS — O26893 Other specified pregnancy related conditions, third trimester: Secondary | ICD-10-CM | POA: Diagnosis not present

## 2020-01-04 DIAGNOSIS — R109 Unspecified abdominal pain: Secondary | ICD-10-CM | POA: Insufficient documentation

## 2020-01-04 DIAGNOSIS — Z3689 Encounter for other specified antenatal screening: Secondary | ICD-10-CM

## 2020-01-04 DIAGNOSIS — Z3A35 35 weeks gestation of pregnancy: Secondary | ICD-10-CM | POA: Diagnosis not present

## 2020-01-04 DIAGNOSIS — Z3493 Encounter for supervision of normal pregnancy, unspecified, third trimester: Secondary | ICD-10-CM

## 2020-01-04 DIAGNOSIS — R4582 Worries: Secondary | ICD-10-CM | POA: Diagnosis not present

## 2020-01-04 DIAGNOSIS — N9489 Other specified conditions associated with female genital organs and menstrual cycle: Secondary | ICD-10-CM

## 2020-01-04 NOTE — MAU Provider Note (Signed)
Chief Complaint:  No chief complaint on file.   First Provider Initiated Contact with Patient 01/04/20 0129     HPI: Lenoria Schuff is a 24 y.o. G1P0 at 17w4dwho presents to maternity admissions reporting  Increased fetal movement today.  States the baby moves so much it bothers her.  She is concerned that it is not normal  Keeps her awake. . She denies pain or other symptoms.   Other This is a new problem. The current episode started in the past 7 days. The problem has been unchanged. Pertinent negatives include no abdominal pain, chills, fever or myalgias. Nothing aggravates the symptoms. She has tried nothing for the symptoms.    RN Note: reports baby increased fetal movement today today. Pt states that is the main reason she came in tonight. Pt states her abd has been tight for the last hour.  Past Medical History: History reviewed. No pertinent past medical history.  Past obstetric history: OB History  Gravida Para Term Preterm AB Living  1            SAB TAB Ectopic Multiple Live Births               # Outcome Date GA Lbr Len/2nd Weight Sex Delivery Anes PTL Lv  1 Current             Past Surgical History: History reviewed. No pertinent surgical history.  Family History: Family History  Problem Relation Age of Onset  . Hypertension Mother   . Diabetes Father   . Hyperlipidemia Father   . Hypertension Father     Social History: Social History   Tobacco Use  . Smoking status: Never Smoker  . Smokeless tobacco: Never Used  Vaping Use  . Vaping Use: Never used  Substance Use Topics  . Alcohol use: No  . Drug use: No    Allergies:  Allergies  Allergen Reactions  . Peanut-Containing Drug Products Hives and Rash    Meds:  Medications Prior to Admission  Medication Sig Dispense Refill Last Dose  . acetaminophen (TYLENOL) 325 MG tablet Take 650 mg by mouth every 6 (six) hours as needed.   Past Week at Unknown time  . calcium carbonate (TUMS - DOSED IN MG  ELEMENTAL CALCIUM) 500 MG chewable tablet Chew 1 tablet by mouth daily.   Past Month at Unknown time  . famotidine (PEPCID) 40 MG tablet Take 40 mg by mouth daily.   01/03/2020 at Unknown time  . Prenatal Vit-Fe Fumarate-FA (PRENATAL MULTIVITAMIN) TABS tablet Take 1 tablet by mouth daily at 12 noon.   01/03/2020 at Unknown time  . ondansetron (ZOFRAN) 4 MG tablet Take 4 mg by mouth 3 (three) times daily as needed.       I have reviewed patient's Past Medical Hx, Surgical Hx, Family Hx, Social Hx, medications and allergies.   ROS:  Review of Systems  Constitutional: Negative for chills and fever.  Gastrointestinal: Negative for abdominal pain.  Genitourinary: Negative for pelvic pain and vaginal bleeding.  Musculoskeletal: Negative for myalgias.   Other systems negative  Physical Exam   Patient Vitals for the past 24 hrs:  BP Temp Pulse Resp SpO2 Height Weight  01/04/20 0052 120/70 98.6 F (37 C) 84 16 100 % 5\' 6"  (1.676 m) 82.1 kg   Constitutional: Well-developed, well-nourished female in no acute distress.  Cardiovascular: normal rate and rhythm Respiratory: normal effort, clear to auscultation bilaterally GI: Abd soft, non-tender, gravid appropriate for gestational age.  No rebound or guarding. MS: Extremities nontender, no edema, normal ROM Neurologic: Alert and oriented x 4.  GU: Neg CVAT.  PELVIC EXAM: deferred  FHT:  Baseline 140-145 , moderate variability, accelerations present, no decelerations Contractions: occasional short contractions   Labs: No results found for this or any previous visit (from the past 24 hour(s)).    Imaging:  No results found.  MAU Course/MDM: NST reviewed   FHR pattern is reactive and reassuring with good accels Discussed fetal movement patterns vary, and we primarily are concerned with decreased movement.  This baby has flipped from breech and it Noble be related to increased movement.  Cannot say for sure why baby does this  Treatments  in MAU included EFM.    Assessment: S[place]gle IUP at [redacted]w[redacted]d Increased fetal movement. Reactive nonstress test  Plan: Discharge home Labor precautions and fetal kick counts Follow up in Office for prenatal visits   Encouraged to return here if she develops worsening of symptoms, increase in pain, fever, or other concerning symptoms.   Pt stable at time of discharge.  Wynelle Bourgeois CNM, MSN Certified Nurse-Midwife 01/04/2020 1:29 AM

## 2020-01-04 NOTE — Discharge Instructions (Signed)
Fetal Monitoring Overview Monitoring your developing baby (fetus) before birth can identify potential problems for you and your baby. Fetal monitoring can:  Help prevent serious problems from developing.  Guide health care providers in how best to care for your unborn baby.  Check your unborn babys overall health. The amount of monitoring and the specific tests that will be done will vary. It will depend on whether your pregnancy is considered to be low or high risk. For example, you Ashley Fletcher need certain tests if you have a medical problem that can put your unborn baby at risk. Health care providers use several techniques and tests to monitor your baby before birth. No single test is perfectly accurate, and you Ashley Fletcher need to follow up with your health care provider if there are any concerns. Fetal movement counts When you are past 20 weeks of pregnancy, you Ashley Fletcher feel your baby move. As your baby gets bigger, these movements are easier to feel. A fetal movement count is when you count the number of movements (kicks, flutters, swishes, rolls, or jabs) over a specific period of time. You record the number and report it to your health care provider. This is often recommended in high-risk pregnancies, but it is good for every pregnant woman to do. Your health care provider Ashley Fletcher ask you to start counting fetal movements at 28 weeks of pregnancy. Non-stress test The non-stress test:  Evaluates fetal heartbeat: ? While your baby is at rest. ? While your baby is moving.  Is often done as part of a set of tests called the biophysical profile.  Ashley Fletcher show signs that it is time for your baby to be delivered. The heart rate in a healthy baby will speed up when the baby moves or kicks. The heart rate will decrease at rest, and the peaks or accelerations of the heart rate will be lower. If the test brings up any questions or concerns, you Ashley Fletcher need to have more testing. This test Ashley Fletcher be done if your pregnancy goes  past your due date. It is also commonly done in high-risk pregnancies beginning at 32 weeks.  Third Trimester of Pregnancy  The third trimester is from week 28 through week 40 (months 7 through 9). This trimester is when your unborn baby (fetus) is growing very fast. At the end of the ninth month, the unborn baby is about 20 inches in length. It weighs about 6-10 pounds. Follow these instructions at home: Medicines  Take over-the-counter and prescription medicines only as told by your doctor. Some medicines are safe and some medicines are not safe during pregnancy.  Take a prenatal vitamin that contains at least 600 micrograms (mcg) of folic acid.  If you have trouble pooping (constipation), take medicine that will make your stool soft (stool softener) if your doctor approves. Eating and drinking   Eat regular, healthy meals.  Avoid raw meat and uncooked cheese.  If you get low calcium from the food you eat, talk to your doctor about taking a daily calcium supplement.  Eat four or five small meals rather than three large meals a day.  Avoid foods that are high in fat and sugars, such as fried and sweet foods.  To prevent constipation: ? Eat foods that are high in fiber, like fresh fruits and vegetables, whole grains, and beans. ? Drink enough fluids to keep your pee (urine) clear or pale yellow. Activity  Exercise only as told by your doctor. Stop exercising if you start to have cramps.  Avoid heavy lifting, wear low heels, and sit up straight.  Do not exercise if it is too hot, too humid, or if you are in a place of great height (high altitude).  You Ashley Fletcher continue to have sex unless your doctor tells you not to. Relieving pain and discomfort  Wear a good support bra if your breasts are tender.  Take frequent breaks and rest with your legs raised if you have leg cramps or low back pain.  Take warm water baths (sitz baths) to soothe pain or discomfort caused by hemorrhoids.  Use hemorrhoid cream if your doctor approves.  If you develop puffy, bulging veins (varicose veins) in your legs: ? Wear support hose or compression stockings as told by your doctor. ? Raise (elevate) your feet for 15 minutes, 3-4 times a day. ? Limit salt in your food. Safety  Wear your seat belt when driving.  Make a list of emergency phone numbers, including numbers for family, friends, the hospital, and police and fire departments. Preparing for your baby's arrival To prepare for the arrival of your baby:  Take prenatal classes.  Practice driving to the hospital.  Visit the hospital and tour the maternity area.  Talk to your work about taking leave once the baby comes.  Pack your hospital bag.  Prepare the baby's room.  Go to your doctor visits.  Buy a rear-facing car seat. Learn how to install it in your car. General instructions  Do not use hot tubs, steam rooms, or saunas.  Do not use any products that contain nicotine or tobacco, such as cigarettes and e-cigarettes. If you need help quitting, ask your doctor.  Do not drink alcohol.  Do not douche or use tampons or scented sanitary pads.  Do not cross your legs for long periods of time.  Do not travel for long distances unless you must. Only do so if your doctor says it is okay.  Visit your dentist if you have not gone during your pregnancy. Use a soft toothbrush to brush your teeth. Be gentle when you floss.  Avoid cat litter boxes and soil used by cats. These carry germs that can cause birth defects in the baby and can cause a loss of your baby (miscarriage) or stillbirth.  Keep all your prenatal visits as told by your doctor. This is important. Contact a doctor if:  You are not sure if you are in labor or if your water has broken.  You are dizzy.  You have mild cramps or pressure in your lower belly.  You have a nagging pain in your belly area.  You continue to feel sick to your stomach, you throw  up, or you have watery poop.  You have bad smelling fluid coming from your vagina.  You have pain when you pee. Get help right away if:  You have a fever.  You are leaking fluid from your vagina.  You are spotting or bleeding from your vagina.  You have severe belly cramps or pain.  You lose or gain weight quickly.  You have trouble catching your breath and have chest pain.  You notice sudden or extreme puffiness (swelling) of your face, hands, ankles, feet, or legs.  You have not felt the baby move in over an hour.  You have severe headaches that do not go away with medicine.  You have trouble seeing.  You are leaking, or you are having a gush of fluid, from your vagina before you are 37 weeks.  You have regular belly spasms (contractions) before you are 37 weeks. Summary  The third trimester is from week 28 through week 40 (months 7 through 9). This time is when your unborn baby is growing very fast.  Follow your doctor's advice about medicine, food, and activity.  Get ready for the arrival of your baby by taking prenatal classes, getting all the baby items ready, preparing the baby's room, and visiting your doctor to be checked.  Get help right away if you are bleeding from your vagina, or you have chest pain and trouble catching your breath, or if you have not felt your baby move in over an hour. This information is not intended to replace advice given to you by your health care provider. Make sure you discuss any questions you have with your health care provider. Document Revised: 06/24/2018 Document Reviewed: 04/08/2016 Elsevier Patient Education  2020 [employer]. You likely only need this test to check your baby's condition if you have a high-risk pregnancy. All types of monitoring aim to protect your health and that of your baby. The best way to have a healthy pregnancy and a healthy baby is to learn as much as you can about your pregnancy and to work closely  with all your health care providers. This information is not intended to replace advice given to you by your health care provider. Make sure you discuss any questions you have with your health care provider. Document Revised: 11/25/2016 Document Reviewed: 10/01/2015 Elsevier Patient Education  2020 [employer].

## 2020-01-04 NOTE — MAU Note (Signed)
Pt reports baby increased fetal movement today today. Pt states that is the main reason she came in tonight. Pt states her abd has been tight for the last hour.

## 2020-01-06 DIAGNOSIS — O3663X0 Maternal care for excessive fetal growth, third trimester, not applicable or unspecified: Secondary | ICD-10-CM | POA: Diagnosis not present

## 2020-01-06 DIAGNOSIS — Z3A35 35 weeks gestation of pregnancy: Secondary | ICD-10-CM | POA: Diagnosis not present

## 2020-01-12 DIAGNOSIS — Z3685 Encounter for antenatal screening for Streptococcus B: Secondary | ICD-10-CM | POA: Diagnosis not present

## 2020-01-12 DIAGNOSIS — Z3A36 36 weeks gestation of pregnancy: Secondary | ICD-10-CM | POA: Diagnosis not present

## 2020-01-12 DIAGNOSIS — Z23 Encounter for immunization: Secondary | ICD-10-CM | POA: Diagnosis not present

## 2020-01-12 DIAGNOSIS — O3663X0 Maternal care for excessive fetal growth, third trimester, not applicable or unspecified: Secondary | ICD-10-CM | POA: Diagnosis not present

## 2020-01-12 LAB — OB RESULTS CONSOLE GBS: GBS: NEGATIVE

## 2020-01-19 DIAGNOSIS — O3663X0 Maternal care for excessive fetal growth, third trimester, not applicable or unspecified: Secondary | ICD-10-CM | POA: Diagnosis not present

## 2020-01-19 DIAGNOSIS — Z3A37 37 weeks gestation of pregnancy: Secondary | ICD-10-CM | POA: Diagnosis not present

## 2020-01-23 DIAGNOSIS — R82998 Other abnormal findings in urine: Secondary | ICD-10-CM | POA: Diagnosis not present

## 2020-01-23 DIAGNOSIS — O365930 Maternal care for other known or suspected poor fetal growth, third trimester, not applicable or unspecified: Secondary | ICD-10-CM | POA: Diagnosis not present

## 2020-01-23 DIAGNOSIS — Z3A38 38 weeks gestation of pregnancy: Secondary | ICD-10-CM | POA: Diagnosis not present

## 2020-01-26 DIAGNOSIS — Z3A38 38 weeks gestation of pregnancy: Secondary | ICD-10-CM | POA: Diagnosis not present

## 2020-01-26 DIAGNOSIS — O365930 Maternal care for other known or suspected poor fetal growth, third trimester, not applicable or unspecified: Secondary | ICD-10-CM | POA: Diagnosis not present

## 2020-01-30 DIAGNOSIS — Z3A39 39 weeks gestation of pregnancy: Secondary | ICD-10-CM | POA: Diagnosis not present

## 2020-01-30 DIAGNOSIS — O365930 Maternal care for other known or suspected poor fetal growth, third trimester, not applicable or unspecified: Secondary | ICD-10-CM | POA: Diagnosis not present

## 2020-02-03 ENCOUNTER — Other Ambulatory Visit: Payer: Self-pay

## 2020-02-03 ENCOUNTER — Inpatient Hospital Stay (HOSPITAL_COMMUNITY): Payer: BC Managed Care – PPO | Admitting: Anesthesiology

## 2020-02-03 ENCOUNTER — Inpatient Hospital Stay (HOSPITAL_COMMUNITY)
Admission: AD | Admit: 2020-02-03 | Discharge: 2020-02-05 | DRG: 768 | Disposition: A | Discharge status: Home / Self Care | Payer: BC Managed Care – PPO | Attending: Obstetrics and Gynecology | Admitting: Obstetrics and Gynecology

## 2020-02-03 ENCOUNTER — Encounter (HOSPITAL_COMMUNITY): Payer: Self-pay | Admitting: Obstetrics and Gynecology

## 2020-02-03 DIAGNOSIS — Z23 Encounter for immunization: Secondary | ICD-10-CM | POA: Diagnosis not present

## 2020-02-03 DIAGNOSIS — Z20822 Contact with and (suspected) exposure to covid-19: Secondary | ICD-10-CM | POA: Diagnosis not present

## 2020-02-03 DIAGNOSIS — O26893 Other specified pregnancy related conditions, third trimester: Secondary | ICD-10-CM | POA: Diagnosis not present

## 2020-02-03 DIAGNOSIS — Z3A39 39 weeks gestation of pregnancy: Secondary | ICD-10-CM | POA: Diagnosis not present

## 2020-02-03 DIAGNOSIS — O2243 Hemorrhoids in pregnancy, third trimester: Secondary | ICD-10-CM | POA: Diagnosis not present

## 2020-02-03 DIAGNOSIS — O358XX0 Maternal care for other (suspected) fetal abnormality and damage, not applicable or unspecified: Secondary | ICD-10-CM | POA: Diagnosis present

## 2020-02-03 LAB — RPR: RPR Ser Ql: NONREACTIVE

## 2020-02-03 LAB — TYPE AND SCREEN
ABO/RH(D): A POS
Antibody Screen: NEGATIVE

## 2020-02-03 LAB — RESPIRATORY PANEL BY RT PCR (FLU A&B, COVID)
Influenza A by PCR: NEGATIVE
Influenza B by PCR: NEGATIVE
SARS Coronavirus 2 by RT PCR: NEGATIVE

## 2020-02-03 LAB — CBC
HCT: 39.3 % (ref 36.0–46.0)
Hemoglobin: 12.9 g/dL (ref 12.0–15.0)
MCH: 27.7 pg (ref 26.0–34.0)
MCHC: 32.8 g/dL (ref 30.0–36.0)
MCV: 84.3 fL (ref 80.0–100.0)
Platelets: 220 10*3/uL (ref 150–400)
RBC: 4.66 MIL/uL (ref 3.87–5.11)
RDW: 13.9 % (ref 11.5–15.5)
WBC: 7.8 10*3/uL (ref 4.0–10.5)
nRBC: 0 % (ref 0.0–0.2)

## 2020-02-03 MED ORDER — OXYTOCIN-SODIUM CHLORIDE 30-0.9 UT/500ML-% IV SOLN
1.0000 m[IU]/min | INTRAVENOUS | Status: DC
  Administered 2020-02-03: 2 m[IU]/min via INTRAVENOUS

## 2020-02-03 MED ORDER — PHENYLEPHRINE 40 MCG/ML (10ML) SYRINGE FOR IV PUSH (FOR BLOOD PRESSURE SUPPORT)
80.0000 ug | PREFILLED_SYRINGE | INTRAVENOUS | Status: DC | PRN
  Filled 2020-02-03: qty 10

## 2020-02-03 MED ORDER — LIDOCAINE HCL (PF) 1 % IJ SOLN
INTRAMUSCULAR | Status: DC | PRN
  Administered 2020-02-03: 6 mL via EPIDURAL

## 2020-02-03 MED ORDER — OXYTOCIN BOLUS FROM INFUSION
333.0000 mL | Freq: Once | INTRAVENOUS | Status: AC
  Administered 2020-02-03: 333 mL via INTRAVENOUS

## 2020-02-03 MED ORDER — FENTANYL-BUPIVACAINE-NACL 0.5-0.125-0.9 MG/250ML-% EP SOLN
12.0000 mL/h | EPIDURAL | Status: DC | PRN
  Filled 2020-02-03: qty 250

## 2020-02-03 MED ORDER — LIDOCAINE HCL (PF) 1 % IJ SOLN: 30.0000 mL | INTRAMUSCULAR | Status: DC | PRN

## 2020-02-03 MED ORDER — OXYCODONE-ACETAMINOPHEN 5-325 MG PO TABS: 2.0000 | ORAL_TABLET | ORAL | Status: DC | PRN

## 2020-02-03 MED ORDER — EPHEDRINE 5 MG/ML INJ
10.0000 mg | INTRAVENOUS | Status: DC | PRN
  Filled 2020-02-03: qty 10

## 2020-02-03 MED ORDER — DIPHENHYDRAMINE HCL 50 MG/ML IJ SOLN
12.5000 mg | INTRAMUSCULAR | Status: AC | PRN
  Administered 2020-02-03 (×3): 12.5 mg via INTRAVENOUS
  Filled 2020-02-03 (×2): qty 1

## 2020-02-03 MED ORDER — FENTANYL CITRATE (PF) 100 MCG/2ML IJ SOLN
100.0000 ug | INTRAMUSCULAR | Status: DC | PRN
  Administered 2020-02-03 (×2): 100 ug via INTRAVENOUS
  Filled 2020-02-03 (×2): qty 2

## 2020-02-03 MED ORDER — TERBUTALINE SULFATE 1 MG/ML IJ SOLN: 0.2500 mg | Freq: Once | INTRAMUSCULAR | Status: DC | PRN

## 2020-02-03 MED ORDER — FLEET ENEMA 7-19 GM/118ML RE ENEM: 1.0000 | ENEMA | Freq: Every day | RECTAL | Status: DC | PRN

## 2020-02-03 MED ORDER — PHENYLEPHRINE 40 MCG/ML (10ML) SYRINGE FOR IV PUSH (FOR BLOOD PRESSURE SUPPORT)
80.0000 ug | PREFILLED_SYRINGE | INTRAVENOUS | Status: AC | PRN
  Administered 2020-02-03 (×3): 80 ug via INTRAVENOUS

## 2020-02-03 MED ORDER — ACETAMINOPHEN 325 MG PO TABS: 650.0000 mg | ORAL_TABLET | ORAL | Status: DC | PRN

## 2020-02-03 MED ORDER — LACTATED RINGERS IV SOLN: 500.0000 mL | INTRAVENOUS | Status: DC | PRN

## 2020-02-03 MED ORDER — LACTATED RINGERS IV SOLN: 500.0000 mL | Freq: Once | INTRAVENOUS | Status: DC

## 2020-02-03 MED ORDER — LACTATED RINGERS AMNIOINFUSION: INTRAVENOUS | Status: DC

## 2020-02-03 MED ORDER — SOD CITRATE-CITRIC ACID 500-334 MG/5ML PO SOLN: 30.0000 mL | ORAL | Status: DC | PRN

## 2020-02-03 MED ORDER — OXYTOCIN-SODIUM CHLORIDE 30-0.9 UT/500ML-% IV SOLN
2.5000 [IU]/h | INTRAVENOUS | Status: DC
  Administered 2020-02-04: 2.5 [IU]/h via INTRAVENOUS
  Filled 2020-02-03: qty 500

## 2020-02-03 MED ORDER — EPHEDRINE 5 MG/ML INJ
10.0000 mg | INTRAVENOUS | Status: AC | PRN
  Administered 2020-02-03 (×2): 10 mg via INTRAVENOUS

## 2020-02-03 MED ORDER — LACTATED RINGERS IV SOLN: INTRAVENOUS | Status: DC

## 2020-02-03 MED ORDER — SODIUM CHLORIDE (PF) 0.9 % IJ SOLN
INTRAMUSCULAR | Status: DC | PRN
  Administered 2020-02-03: 12 mL/h via EPIDURAL

## 2020-02-03 MED ORDER — ONDANSETRON HCL 4 MG/2ML IJ SOLN: 4.0000 mg | Freq: Four times a day (QID) | INTRAMUSCULAR | Status: DC | PRN

## 2020-02-03 MED ORDER — OXYCODONE-ACETAMINOPHEN 5-325 MG PO TABS: 1.0000 | ORAL_TABLET | ORAL | Status: DC | PRN

## 2020-02-03 NOTE — Progress Notes (Signed)
S: Comfortable with epidural.    O: Vitals:   02/03/20 1924 02/03/20 1930 02/03/20 1933 02/03/20 2001  BP:  (!) 87/43 91/68 (!) 99/56  Pulse:  77  70  Resp:      Temp: 98.2 F (36.8 C)     TempSrc: Axillary     SpO2:  100%  100%  Weight:      Height:       FHT:  FHR: 150 bpm, variability: moderate,  accelerations:  Present,  decelerations:  Absent UC:   regular, every 1-3 minutes, MVUs 240 SVE:   Dilation: 8 Effacement (%): 80 Station: 0 Exam by:: [name], CNM  A / P: SROM at 0100 clear fluid, augmentation of labor, Pitocin infusing at 108mu  Fetal Wellbeing:  Category I Pain Control:  Epidural Anticipated MOD:  NSVD   Will recheck SVE in 2-3 hours or sooner if needed.   June Leap, CNM, MSN 02/03/2020, 8:33 PM

## 2020-02-03 NOTE — Anesthesia Preprocedure Evaluation (Signed)
Anesthesia Evaluation  Patient identified by MRN, date of birth, ID band Patient awake    Reviewed: Allergy & Precautions, H&P , NPO status , Patient's Chart, lab work & pertinent test results, reviewed documented beta blocker date and time   Airway Mallampati: II  TM Distance: >3 FB Neck ROM: full    Dental no notable dental hx. (+) Teeth Intact, Dental Advisory Given   Pulmonary neg pulmonary ROS,    Pulmonary exam normal breath sounds clear to auscultation       Cardiovascular negative cardio ROS Normal cardiovascular exam Rhythm:regular Rate:Normal     Neuro/Psych negative neurological ROS  negative psych ROS   GI/Hepatic negative GI ROS, Neg liver ROS,   Endo/Other  negative endocrine ROS  Renal/GU negative Renal ROS  negative genitourinary   Musculoskeletal   Abdominal   Peds  Hematology negative hematology ROS (+)   Anesthesia Other Findings   Reproductive/Obstetrics (+) Pregnancy                             Anesthesia Physical Anesthesia Plan  ASA: II  Anesthesia Plan: Epidural   Post-op Pain Management:    Induction:   PONV Risk Score and Plan: 2  Airway Management Planned: Natural Airway  Additional Equipment:   Intra-op Plan:   Post-operative Plan:   Informed Consent: I have reviewed the patients History and Physical, chart, labs and discussed the procedure including the risks, benefits and alternatives for the proposed anesthesia with the patient or authorized representative who has indicated his/her understanding and acceptance.       Plan Discussed with: Anesthesiologist  Anesthesia Plan Comments:         Anesthesia Quick Evaluation

## 2020-02-03 NOTE — Progress Notes (Signed)
S: Comfortable with epidural. Family at the bedside.   O: Vitals:   02/03/20 1236 02/03/20 1241 02/03/20 1246 02/03/20 1251  BP: (!) 78/41 (!) 97/52 (!) 96/45 91/71  Pulse: 75 76 61 (!) 112  Resp:  18 18 18   Temp:      TempSrc:      SpO2: 100% 100% 100% 100%  Weight:      Height:       FHT:  FHR: 150 bpm, variability: moderate,  accelerations:  Present,  decelerations:  Present Variables to 120s lasting less than 20 seconds UC:   irregular, every 6 minutes SVE:   Dilation: 3 Effacement (%): 80 Station: -1 Exam by:: [email or web address] RN   Vertex verified by ultrasound before Pitocin is restarted  A / P: SROM at 0100, clear fluid  Fetal Wellbeing:  Category I Pain Control:  Epidural Anticipated MOD:  NSVD   Will restart Pitocin 2x2 until active labor is achieved.   Foye Clock, CNM, MSN 02/03/2020, 1:18 PM

## 2020-02-03 NOTE — Anesthesia Procedure Notes (Signed)
Epidural Patient location during procedure: OB Start time: 02/03/2020 11:34 AM End time: 02/03/2020 11:39 AM  Staffing Anesthesiologist: Bethena Midget, MD  Preanesthetic Checklist Completed: patient identified, IV checked, site marked, risks and benefits discussed, surgical consent, monitors and equipment checked, pre-op evaluation and timeout performed  Epidural Patient position: sitting Prep: DuraPrep and site prepped and draped Patient monitoring: continuous pulse ox and blood pressure Approach: midline Location: L3-L4 Injection technique: LOR air  Needle:  Needle type: Tuohy  Needle gauge: 17 G Needle length: 9 cm and 9 Needle insertion depth: 6 cm Catheter type: closed end flexible Catheter size: 19 Gauge Catheter at skin depth: 11 cm Test dose: negative  Assessment Events: blood not aspirated, injection not painful, no injection resistance, no paresthesia and negative IV test

## 2020-02-03 NOTE — MAU Note (Signed)
Leaking cl fld since 0140. Occ mild ctxs. Was 3cm last sve.

## 2020-02-03 NOTE — Progress Notes (Addendum)
S: Comfortable with epidural. Reports increased shakiness and tingling in her legs. States she has all over body itching. Family at the bedside. Discussed the R/B/A of IUPC placement for contraction monitoring and amnioinfusion. Patient consents to procedure.   O: Vitals:   02/03/20 1546 02/03/20 1556 02/03/20 1601 02/03/20 1605  BP: (!) 87/48  (!) 84/52   Pulse: 98  88   Resp:   18   Temp:   (!) 97.5 F (36.4 C)   TempSrc:   Oral   SpO2: 100% 100% 100% 99%  Weight:      Height:       FHT:  FHR: 135 bpm, variability: moderate,  accelerations:  Present,  decelerations:  Present Variable decelerations  to 90-110s lasting 20-40 seconds UC:   regular, every 3-4 minutes SVE:   Dilation: 6 Effacement (%): 80 Station: 0 Exam by:: Marchelle Folks CNM  IUPC placed without difficulty  A / P: SROM at 0100 clear fluid, augmentation with Pitocin, currently at 75mu  Fetal Wellbeing:  Category I Pain Control:  Epidural Anticipated MOD:  NSVD   Will give 25mg  of IV Benadryl for itching.  Start amnioinfusion for variable decelerations.   Continue to titrate Pitocin until adequate MVUs.   Consult anesthesia for continued hypotension despite interventions.   , CNM, MSN 02/03/2020, 4:32 PM

## 2020-02-03 NOTE — H&P (Signed)
OB ADMISSION/ HISTORY & PHYSICAL:  Admission Date: 02/03/2020  3:41 AM  Admit Diagnosis: Normal labor [O80, Z37.9]    Ashley Fletcher is a 24 y.o. female G1P0 at [redacted]w[redacted]d presenting for SROM for clear fluid at 1:40am. She reported an increase in [facility] ctxs and cramping prior to SROM.  Now, she reports mild, irregular contraction. Husband, Hulan Amato, and patient's mother present and supportive. Expecting baby boy, [name].   Prenatal History: G1P0   EDC : 02/04/2020, Date entered prior to episode creation  Prenatal care at [facility] since 8 weeks   Prenatal course complicated by: - Bilateral choroid plexus cysts and echogenic intracardiac focus, normal NIPS - Threatened PTL and 31 weeks, negative FFN, was on Procardia PRN, but stopped due to headaches - Hemorrhoids - Low maternal weight gain 12# total  Prenatal Labs: ABO, Rh:   A Positive  Antibody: NEG (11/19 0422) Rubella:   Immune Varicella: non-immune RPR:   pending HBsAg:  Negative  HIV:   NR GBS: Negative/-- (10/28 0000)  Hemoglobinopathy: normal  1 hr Glucola : 113 Genetic Screening: Low Risk [facility] NIPS Ultrasound: Bilateral choroid plexus cysts and echogenic intracardiac focus, female anatomy F/u sono at 30 weeks: AGA F/u sono at 36 weeks: vtx, EFW 6#3 @ 33% F/u sono at [redacted]w[redacted]d: vtx, EFW 50%, AFI 20cm, anterior placenta  Tdap UTD: given prenatally  Declined COVID vaccine until PP Flu vaccine: declines     Maternal Diabetes: No Genetic Screening: Normal Maternal Ultrasounds/Referrals: Isolated EIF (echogenic intracardiac focus) and Isolated choroid plexus cyst Fetal Ultrasounds or other Referrals:  None Maternal Substance Abuse:  No Significant Maternal Medications:  Meds include: Other: Pepcid Significant Maternal Lab Results:  Group B Strep negative Other Comments:  None  Medical / Surgical History :  Past medical history: History reviewed. No pertinent past medical history.   Past surgical history: History reviewed.  No pertinent surgical history.   Family History:  Family History  Problem Relation Age of Onset  . Hypertension Mother   . Diabetes Father   . Hyperlipidemia Father   . Hypertension Father      Social History:  reports that she has never smoked. She has never used smokeless tobacco. She reports that she does not drink alcohol and does not use drugs.   Allergies: Peanut-containing drug products   Current Medications at time of admission:  Medications Prior to Admission  Medication Sig Dispense Refill Last Dose  . calcium carbonate (TUMS - DOSED IN MG ELEMENTAL CALCIUM) 500 MG chewable tablet Chew 1 tablet by mouth daily.   02/02/2020 at Unknown time  . famotidine (PEPCID) 40 MG tablet Take 40 mg by mouth daily.   02/02/2020 at Unknown time  . Prenatal Vit-Fe Fumarate-FA (PRENATAL MULTIVITAMIN) TABS tablet Take 1 tablet by mouth daily at 12 noon.   02/03/2020 at Unknown time  . acetaminophen (TYLENOL) 325 MG tablet Take 650 mg by mouth every 6 (six) hours as needed.   Unknown at Unknown time  . ondansetron (ZOFRAN) 4 MG tablet Take 4 mg by mouth 3 (three) times daily as needed.   Unknown at Unknown time     Review of Systems: Review of Systems  All other systems reviewed and are negative. (+) LOF clear, pink tinged since 1:40am Reports mild, irregular ctxs and cramping  Denies VB  Physical Exam: Vital signs and nursing notes reviewed.  ED Triage Vitals  Enc Vitals Group     BP 02/03/20 0357 118/67     Pulse Rate  02/03/20 0357 99     Resp 02/03/20 0355 16     Temp 02/03/20 0355 98.3 F (36.8 C)     Temp Source 02/03/20 0440 Oral     SpO2 --      Weight 02/03/20 0355 183 lb (83 kg)     Height 02/03/20 0355 5\' 6"  (1.676 m)     Head Circumference --      Peak Flow --      Pain Score 02/03/20 0357 5     Pain Loc --      Pain Edu? --      Excl. in GC? --      General: AAO x 3, NAD, pleasant, breathing well through contractions Heart: RRR Lungs:CTAB Abdomen: Gravid,  NT, Leopold's EFW 7#8 Extremities: no edema Genitalia / VE: Dilation: 3 Effacement (%): 80 Station: -2 Presentation: Vertex Exam by:: [email or web address] CNM  Difficult exam due to patient discomfort  FHR: 140 BPM, moderate variability, +15x15 accels, no decels TOCO: Ctx irregular, mild  Labs:   Pending T&S, CBC, RPR  Recent Labs    02/03/20 0419  WBC 7.8  HGB 12.9  HCT 39.3  PLT 220       Assessment:  24 y.o. G1P0 at [redacted]w[redacted]d with SROM at 1:40am   1. Latent stage of labor 2. FHR category 1 3. GBS Negative 4. Desires unmedicated, low intervention birth 5. Breastfeeding 6. Placenta disposal per patient request  Plan:  1. Admit to BS  - We reviewed options for augmentation including Pitocin and buccal Cytotec. R/B  reviewed. At this time, patient declines and desires expectant management until 8:30am, to allow her body more time to contract naturally. She also plans to rest. Light labor diet.   - SL IV site  - Pt. okay with continuous fetal monitoring for now  - Discussed position changes, birth ball, [redacted]w[redacted]d after rest   - Planning circ for baby, okay for all medications for baby 2. Routine L&D orders 3. Analgesia/anesthesia PRN - considering epidural in active labor 4. Expectant management 5. Anticipate NSVB   Dr. [employer] notified of admission / plan of care   Billy Coast CNM, MSN 02/03/2020, 6:34 AM

## 2020-02-03 NOTE — MAU Provider Note (Signed)
Asked to confirm presentation  Pt informed that the ultrasound is considered a limited OB ultrasound and is not intended to be a complete ultrasound exam.  Patient also informed that the ultrasound is not being completed with the intent of assessing for fetal or placental anomalies or any pelvic abnormalities.  Explained that the purpose of today's ultrasound is to assess for presentation, BPP and amniotic fluid volume.  Patient acknowledges the purpose of the exam and the limitations of the study.    Fetus is presenting in the vertex presentation  Aviva Signs, CNM

## 2020-02-03 NOTE — Progress Notes (Signed)
S: Feeling more pain with contractions. Mother at the bedside providing support.   O: Vitals:   02/03/20 0355 02/03/20 0357 02/03/20 0440 02/03/20 0624  BP:  118/67 113/78 123/73  Pulse:  99 75 70  Resp: 16  18   Temp: 98.3 F (36.8 C)  97.8 F (36.6 C) 98 F (36.7 C)  TempSrc:   Oral Oral  Weight: 83 kg     Height: 5\' 6"  (1.676 m)      FHT:  FHR: 140 bpm, variability: moderate,  accelerations:  Present,  decelerations:  Absent UC:   irregular, every 4-5 minutes SVE:   Dilation: 3 Effacement (%): 80 Station: -2 Exam by:: [email or web address] CNM  A / P: SROM at 0100, clear fluid  Fetal Wellbeing:  Category I Pain Control:  Labor support without medications Anticipated MOD:  NSVD   Patient Ashley Fletcher have a light laboring diet and then will start Pitocin 2x2 until active labor is achieved. Patient Ashley Fletcher have epidural upon request.   Marchelle Folks, MSN 02/03/2020, 8:41 AM

## 2020-02-04 ENCOUNTER — Encounter (HOSPITAL_COMMUNITY): Payer: Self-pay | Admitting: Obstetrics and Gynecology

## 2020-02-04 LAB — CBC
HCT: 35 % — ABNORMAL LOW (ref 36.0–46.0)
Hemoglobin: 11.6 g/dL — ABNORMAL LOW (ref 12.0–15.0)
MCH: 28 pg (ref 26.0–34.0)
MCHC: 33.1 g/dL (ref 30.0–36.0)
MCV: 84.5 fL (ref 80.0–100.0)
Platelets: 199 10*3/uL (ref 150–400)
RBC: 4.14 MIL/uL (ref 3.87–5.11)
RDW: 13.9 % (ref 11.5–15.5)
WBC: 16.5 10*3/uL — ABNORMAL HIGH (ref 4.0–10.5)
nRBC: 0 % (ref 0.0–0.2)

## 2020-02-04 MED ORDER — BENZOCAINE-MENTHOL 20-0.5 % EX AERO
1.0000 "application " | INHALATION_SPRAY | CUTANEOUS | Status: DC | PRN
  Filled 2020-02-04 (×2): qty 56

## 2020-02-04 MED ORDER — ACETAMINOPHEN 325 MG PO TABS: 650.0000 mg | ORAL_TABLET | ORAL | Status: DC | PRN

## 2020-02-04 MED ORDER — PRENATAL MULTIVITAMIN CH
1.0000 | ORAL_TABLET | Freq: Every day | ORAL | Status: DC
  Administered 2020-02-04 – 2020-02-05 (×2): 1 via ORAL
  Filled 2020-02-04 (×2): qty 1

## 2020-02-04 MED ORDER — SENNOSIDES-DOCUSATE SODIUM 8.6-50 MG PO TABS
2.0000 | ORAL_TABLET | ORAL | Status: DC
  Administered 2020-02-05: 2 via ORAL
  Filled 2020-02-04: qty 2

## 2020-02-04 MED ORDER — WITCH HAZEL-GLYCERIN EX PADS: 1.0000 "application " | MEDICATED_PAD | CUTANEOUS | Status: DC | PRN

## 2020-02-04 MED ORDER — TETANUS-DIPHTH-ACELL PERTUSSIS 5-2.5-18.5 LF-MCG/0.5 IM SUSY: 0.5000 mL | PREFILLED_SYRINGE | Freq: Once | INTRAMUSCULAR | Status: DC

## 2020-02-04 MED ORDER — DIPHENHYDRAMINE HCL 25 MG PO CAPS
25.0000 mg | ORAL_CAPSULE | Freq: Four times a day (QID) | ORAL | Status: DC | PRN
  Administered 2020-02-04: 25 mg via ORAL
  Filled 2020-02-04: qty 1

## 2020-02-04 MED ORDER — ZOLPIDEM TARTRATE 5 MG PO TABS: 5.0000 mg | ORAL_TABLET | Freq: Every evening | ORAL | Status: DC | PRN

## 2020-02-04 MED ORDER — IBUPROFEN 600 MG PO TABS
600.0000 mg | ORAL_TABLET | Freq: Four times a day (QID) | ORAL | Status: DC
  Administered 2020-02-04 – 2020-02-05 (×6): 600 mg via ORAL
  Filled 2020-02-04 (×6): qty 1

## 2020-02-04 MED ORDER — OXYCODONE HCL 5 MG PO TABS: 10.0000 mg | ORAL_TABLET | ORAL | Status: DC | PRN

## 2020-02-04 MED ORDER — DIBUCAINE (PERIANAL) 1 % EX OINT: 1.0000 "application " | TOPICAL_OINTMENT | CUTANEOUS | Status: DC | PRN

## 2020-02-04 MED ORDER — ONDANSETRON HCL 4 MG/2ML IJ SOLN: 4.0000 mg | INTRAMUSCULAR | Status: DC | PRN

## 2020-02-04 MED ORDER — SIMETHICONE 80 MG PO CHEW: 80.0000 mg | CHEWABLE_TABLET | ORAL | Status: DC | PRN

## 2020-02-04 MED ORDER — COCONUT OIL OIL
1.0000 "application " | TOPICAL_OIL | Status: DC | PRN
  Administered 2020-02-05: 1 via TOPICAL

## 2020-02-04 MED ORDER — ONDANSETRON HCL 4 MG PO TABS: 4.0000 mg | ORAL_TABLET | ORAL | Status: DC | PRN

## 2020-02-04 MED ORDER — OXYCODONE HCL 5 MG PO TABS
5.0000 mg | ORAL_TABLET | ORAL | Status: DC | PRN
  Administered 2020-02-04: 5 mg via ORAL
  Filled 2020-02-04: qty 1

## 2020-02-04 NOTE — Lactation Note (Signed)
This note was copied from a baby's chart. Lactation Consultation Note  Patient Name: Boy Phoua Helmers Today's Date: 02/04/2020 Reason for consult: Follow-up assessment;Term;Primapara;1st time breastfeeding   P1 mother whose infant is now 16 hours old.  This is a term baby at 39+6 weeks.   Grandmother was attempting to bottle feed baby when I arrived.  Mother had pumped 10 mls of EBM.  Offered to assist with the feeding and mother agreeable.  Assessed suck prior to nipple feeding.  Baby was tongue thrusting and was not able to grasp the nipple.  With 10 minutes of suck training and jaw/cheek support he was able to grasp the nipple and consumed 9 mls of the EBM.  Demonstrated the feeding for parents and grandmother to observe; also demonstrated burping.  Baby was very content after feeding.  Swaddled him and placed him in father's arms and he remained sleeping.  Mother will continue to feed 8-12 times/24 hours or sooner if he shows feeding cues.  Encouraged hand expression before/after feedings.  Mother desires to continue to post pump after feedings to provided extra nourishment.  Praised her efforts.  Asked her to use the last ml of EBM to rub into nipples/areolas for comfort.    Family members receptive to all teaching.  Reassured mother babies are usually very sleepy the first 24 hours after delivery.  Suggested she call for latch assistance as needed.  RN updated.   Maternal Data    Feeding Feeding Type: Bottle Fed - Breast Milk Nipple Type: Slow - flow  LATCH Score                   Interventions Interventions: Breast feeding basics reviewed;Hand express  Lactation Tools Discussed/Used Tools: Bottle   Consult Status Consult Status: Follow-up Date: 02/05/20 Follow-up type: In-patient    Dora Sims 02/04/2020, 2:31 PM

## 2020-02-04 NOTE — Lactation Note (Signed)
This note was copied from a baby's chart. Lactation Consultation Note  Patient Name: Ashley Fletcher Today's Date: 02/04/2020 Reason for consult: Follow-up assessment;1st time breastfeeding;Primapara;Term;Difficult latch Called to mom's room for difficult latch, mom reports baby latched and breast fed within 1st hour postpartum but has not latched since. Reports last feeding at 2pm was unable to latch, baby received 85ml EBM. LC assisted with latching baby in football and cross cradle hold to left and right breast, baby opens mouth wide but pulls back to suck on nipple. Mom with everted nipples bilat, fitted for 35mm nipple shield, baby latched and sucked but no swallows noted, no evidence of milk in shield. LC easily hand expressed ~26ml colostrum, finger fed to baby, baby with biting and thrusting on finger. Mom up to DEBP and advised to offer EBM via spoon or bottle. LC changed moderated stool diaper. Reinforced cue based feedings, wake if >3hrs since last feeding, skin to skin, and signs of proper latch. Left the room with mom pumping and dad holding baby.    Plan - feed on cue, wake if >3hrs since last feeding - use 38mm nipple shield if with difficulty latching to breast - pump and offer EBM after each feeding - clean pump parts and shield after each use - call for LC support if needed with next latch  Maternal Data    Feeding Feeding Type: Breast Fed  LATCH Score Latch: Repeated attempts needed to sustain latch, nipple held in mouth throughout feeding, stimulation needed to elicit sucking reflex.  Audible Swallowing: None  Type of Nipple: Everted at rest and after stimulation  Comfort (Breast/Nipple): Soft / non-tender  Hold (Positioning): Assistance needed to correctly position infant at breast and maintain latch.  LATCH Score: 6  Interventions Interventions: Breast feeding basics reviewed;Assisted with latch;Skin to skin;Breast massage;Hand express;Breast compression;Adjust  position;Support pillows;Position options;Expressed milk;DEBP  Lactation Tools Discussed/Used Tools: Nipple Shields Nipple shield size: 20   Consult Status Consult Status: Follow-up Date: 02/05/20 Follow-up type: In-patient    Charlynn Court 02/04/2020, 7:13 PM

## 2020-02-04 NOTE — Progress Notes (Signed)
Obstetric Laceration Repair Note  Called to patient's room by delivery provider, [name], CNM to evaluate perineal laceration. Patient had an uncomplicated spontaneous vaginal delivery. Please see delivery note for further details. Patient found to have well working epidural in place and remained comfortable throughout the examination and repair. Inspection revealed a 3rd degree perineal laceration measuring > 2.5 cm in length. One finger was inserted into the rectum and confirmed absence of an occult 4th degree. Exam gloves changed and 3rd degree repaired in the usual fashion. Alis clamps were used to grasp the retracted external sphincter muscle edges bilaterally. The sphincter complex was repaired in an end to end fashion, using figure of eight sutures with 0-Vicryl suture. The remaining 2nd degree laceration was then repaired in the usual fashion using 2-0 Vicryl. Restoration of normal anatomy was achieved. The vaginal mucosa was noted to be friable with continued bleeding. The decision was made to place vaginal packing. Kerlix moistened with Betadine solution was used to pack the vagina. Foley catheter replaced. Plan to remove both packing and catheter in the morning and reassess bleeding at that time.   Ashley Fletcher 02/04/20 1:57 AM

## 2020-02-04 NOTE — Progress Notes (Signed)
PPD # 1 S/P NSVD  Live born female  Birth Weight: 6 lb 10.4 oz (3016 g) APGAR: 7, 9  Newborn Delivery   Birth date/time: 02/03/2021 23:42:00 Delivery type: Vaginal, Spontaneous     Baby name: Ashley Fletcher Delivering provider: [name] K   Episiotomy:None   Lacerations:3rd degree   Circumcision Yes, desires inpatient  Feeding: breast  Pain control at delivery: Epidural   Subjective   Reports feeling well but very tired. No perineal pain but states there is pressure. Breastfeeding going well. Mother, Ashley Fletcher, at the beside.              Tolerating po/ No nausea or vomiting             Bleeding is light             Pain controlled with ibuprofen (OTC)             Up ad lib / ambulatory / voiding without difficulties   Objective   A & O x 3, in no apparent distress              VS:  Vitals:   02/04/20 0133 02/04/20 0146 02/04/20 0230 02/04/20 0340  BP: 109/76 118/74 115/76 114/71  Pulse: 99 (!) 111 98 99  Resp:   18 18  Temp:   98.4 F (36.9 C) 98.5 F (36.9 C)  TempSrc:   Oral Oral  SpO2:   100% 100%  Weight:      Height:        LABS:  Recent Labs    02/03/20 0419 02/04/20 0530  WBC 7.8 16.5*  HGB 12.9 11.6*  HCT 39.3 35.0*  PLT 220 199     Blood type: --/--/A POS (11/19 0422)  Rubella:   Immune  Vaccines:   TDaP   UTD                   Flu       Declined                             COVID-19 Declined   Gen: AAO x 3, NAD  Abdomen: soft, non-tender, non-distended             Fundus: firm, non-tender, U-1  Perineum: repair intact, no edema, vaginal packing removed  Lochia: small  Extremities: no edema, no calf pain or tenderness   Assessment/Plan PPD # 1 24 y.o., G1P1001  Principal Problem:   Postpartum care following vaginal delivery 11/19  Doing well - stable status  Routine post partum orders Active Problems:   Normal labor   SVD (spontaneous vaginal delivery) 11/19   Third degree perineal laceration  Vaginal packing removed  Will remove  foley catheter  Oxy IR for pain  Discussed perineal care and comfort measures  Follow-up in 2 weeks at the office  Anticipate discharge tomorrow.   June Leap, MSN, CNM 02/04/2020, 7:46 AM

## 2020-02-04 NOTE — Lactation Note (Signed)
This note was copied from a baby's chart. Lactation Consultation Note Baby 7 hrs old When LC came into room mom stated thanks goodness you're here.  LC un-swaddled baby, placed pillows at mom's side.  Assisted baby in football hold. Baby latched well after a couple of times trying. Mom has good everted compressible nipples. Baby BF good. Heard swallows. Newborn behavior, STS, I&O, breast massage, milk storage, supply and demand. Mom encouraged to feed baby 8-12 times/24 hours and with feeding cues. Mom encouraged to waken baby for feeds.  Mom denies painful latch. Encouraged to call for assistance or questions. Lactation brochure given.  Patient Name: Ashley Fletcher Today's Date: 02/04/2020 Reason for consult: Initial assessment;Primapara;Term   Maternal Data Has patient been taught Hand Expression?: Yes Does the patient have breastfeeding experience prior to this delivery?: No  Feeding Feeding Type: Breast Fed  LATCH Score Latch: Grasps breast easily, tongue down, lips flanged, rhythmical sucking.  Audible Swallowing: A few with stimulation  Type of Nipple: Everted at rest and after stimulation  Comfort (Breast/Nipple): Soft / non-tender  Hold (Positioning): Assistance needed to correctly position infant at breast and maintain latch.  LATCH Score: 8  Interventions Interventions: Breast feeding basics reviewed;Adjust position;Assisted with latch;Support pillows;Skin to skin;Position options;Breast massage;Expressed milk;Hand express;Breast compression  Lactation Tools Discussed/Used WIC Program: No   Consult Status Consult Status: Follow-up Date: 02/05/20 Follow-up type: In-patient    Charyl Dancer 02/04/2020, 7:14 AM

## 2020-02-04 NOTE — Anesthesia Postprocedure Evaluation (Signed)
Anesthesia Post Note  Patient: Ashley Fletcher  Procedure(s) Performed: AN AD HOC LABOR EPIDURAL     Patient location during evaluation: Mother Baby Anesthesia Type: Epidural Level of consciousness: awake and alert Pain management: pain level controlled Vital Signs Assessment: post-procedure vital signs reviewed and stable Respiratory status: spontaneous breathing, nonlabored ventilation and respiratory function stable Cardiovascular status: stable Postop Assessment: no headache, no backache and epidural receding Anesthetic complications: no   No complications documented.  Last Vitals:  Vitals:   02/04/20 0340 02/04/20 0805  BP: 114/71 (!) 102/53  Pulse: 99 99  Resp: 18 18  Temp: 36.9 C 37 C  SpO2: 100% 100%    Last Pain:  Vitals:   02/04/20 0805  TempSrc: Oral  PainSc: 5    Pain Goal: Patients Stated Pain Goal: 0 (02/03/20 0357)              Epidural/Spinal Function Cutaneous sensation: Normal sensation (02/04/20 0805), Patient able to flex knees: Yes (02/04/20 0805), Patient able to lift hips off bed: Yes (02/04/20 0805), Back pain beyond tenderness at insertion site: No (02/04/20 0805), Progressively worsening motor and/or sensory loss: No (02/04/20 0805), Bowel and/or bladder incontinence post epidural: No (02/04/20 0805)  Emmaline Kluver N

## 2020-02-05 MED ORDER — ACETAMINOPHEN 500 MG PO TABS: 1000.0000 mg | ORAL_TABLET | Freq: Four times a day (QID) | ORAL | 2 refills | Status: AC | PRN

## 2020-02-05 MED ORDER — SENNOSIDES-DOCUSATE SODIUM 8.6-50 MG PO TABS
2.0000 | ORAL_TABLET | ORAL | Status: DC
Start: 2020-02-06 — End: 2020-11-06

## 2020-02-05 MED ORDER — WITCH HAZEL-GLYCERIN EX PADS: 1.0000 "application " | MEDICATED_PAD | CUTANEOUS | 12 refills | Status: DC | PRN

## 2020-02-05 MED ORDER — BENZOCAINE-MENTHOL 20-0.5 % EX AERO: 1.0000 "application " | INHALATION_SPRAY | CUTANEOUS | Status: DC | PRN

## 2020-02-05 MED ORDER — COCONUT OIL OIL: 1.0000 "application " | TOPICAL_OIL | 0 refills | Status: DC | PRN

## 2020-02-05 MED ORDER — DIBUCAINE (PERIANAL) 1 % EX OINT: 1.0000 "application " | TOPICAL_OINTMENT | CUTANEOUS | Status: DC | PRN

## 2020-02-05 MED ORDER — IBUPROFEN 600 MG PO TABS
600.0000 mg | ORAL_TABLET | Freq: Four times a day (QID) | ORAL | 0 refills | Status: DC
Start: 2020-02-05 — End: 2020-11-06

## 2020-02-05 NOTE — Discharge Instructions (Signed)
Lactation outpatient support - home visit  Linda Coppola RN, MHA, IBCLC at [facility]: Lactation Consultant  [email or web address] Mail: [email or web address] Tel: [phone]    Additional resources:  [facility] [email or web address]   Chiropractic specialist   [email or web address] Leanna Hastings [email or web address]  Craniosacral therapy for baby  Erin Balkind  [email or web address]  

## 2020-02-05 NOTE — Lactation Note (Signed)
This note was copied from a baby's chart. Lactation Consultation Note  Patient Name: Ashley Fletcher Today's Date: 02/05/2020 Reason for consult: Follow-up assessment   P1 mother whose infant is now 67 hours old.  This is a term baby at 39+6 weeks.  Baby was circumcised this morning and was asleep in mother's arms when I arrived.  Mother stated that he cluster fed last night and that it is "hit or miss" whether he latches and feeds.  In reviewing the flowsheets, baby has not received enough volume when he does not breast feed.  Reviewed supplementation guidelines with mother and suggested she provide more supplementation to minimize weight loss and increase alertness for feeding.  Mother has been pumping but not consistently.  She has not pumped since approximately 0400 this morning.  She will pump now for 15 minutes.  Encouraged her to always feed back any EBM she obtains to baby.  She has been able to pump 5-10 mls/session.  Per RN, mother has a supply of breast milk in the freezer at home already.  Asked mother to call me for latch assistance with the next feeding.  I would like to observe his ability to breast feed and/or supplement via bottle feeding.  I did have to work with him yesterday to provide mother's breast milk supplementation.  Also discussed using the cup feeder to avoid the artificial nipple if mother desires.  She will call when baby is ready to feed.  Mother's discharge order is in; awaiting the pediatrician's order now.  Mother is ready for discharge.  Grandmother present.  RN updated.   Maternal Data    Feeding    LATCH Score                   Interventions    Lactation Tools Discussed/Used     Consult Status Consult Status: Follow-up Date: 02/05/20 Follow-up type: In-patient    Ashley Fletcher 02/05/2020, 10:21 AM

## 2020-02-05 NOTE — Discharge Summary (Signed)
OB Discharge Summary  Patient Name: Ashley Fletcher DOB: 1995/07/14 MRN: [id number]  Date of admission: 02/03/2020 Delivering provider: [name] K   Admitting diagnosis: Normal labor [O80, Z37.9] Intrauterine pregnancy: [redacted]w[redacted]d     Secondary diagnosis: Patient Active Problem List   Diagnosis Date Noted  . SVD (spontaneous vaginal delivery) 11/19 02/04/2020  . Third degree perineal laceration 02/04/2020  . Postpartum care following vaginal delivery 11/19 02/04/2020  . Normal labor 02/03/2020   Additional problems:none   Date of discharge: 02/05/2020   Discharge diagnosis: Principal Problem:   Postpartum care following vaginal delivery 11/19 Active Problems:   Normal labor   SVD (spontaneous vaginal delivery) 11/19   Third degree perineal laceration                                                              Post partum procedures:none  Augmentation: Pitocin Pain control: Epidural  Laceration:3rd degree  Episiotomy:None  Complications: None  Hospital course:  Onset of Labor With Vaginal Delivery      24 y.o. yo G1P1001 at [redacted]w[redacted]d was admitted in Latent Labor on 02/03/2020. Patient had an uncomplicated labor course as follows:  Membrane Rupture Time/Date: 2:00 AM ,02/03/2020   Delivery Method:Vaginal, Spontaneous  Episiotomy: None  Lacerations:  3rd degree  Patient had an uncomplicated postpartum course.  She is ambulating, tolerating a regular diet, passing flatus, and urinating well. Patient is discharged home in stable condition on 02/05/20.  Newborn Data: Birth date:02/03/2020  Birth time:11:42 PM  Gender:Female  Living status:Living  Apgars:7 ,9  Weight:3016 g   Physical exam  Vitals:   02/04/20 1200 02/04/20 1557 02/04/20 1940 02/05/20 0508  BP: 104/75 123/69 112/73 (!) 141/65  Pulse: 84 82 (!) 106   Resp: 18 19 15 16   Temp: 98.2 F (36.8 C) 98.5 F (36.9 C) 99 F (37.2 C) 98.1 F (36.7 C)  TempSrc:  Oral Oral Oral  SpO2: 100% 100% 100% 100%  Weight:       Height:       General: alert, cooperative and no distress Lochia: appropriate Uterine Fundus: firm Incision: N/A Perineum: repair intact, no edema, inflamed hemorrhoid w/o thrombosis DVT Evaluation: No cords or calf tenderness. No significant calf/ankle edema. Labs: Lab Results  Component Value Date   WBC 16.5 (H) 02/04/2020   HGB 11.6 (L) 02/04/2020   HCT 35.0 (L) 02/04/2020   MCV 84.5 02/04/2020   PLT 199 02/04/2020   CMP Latest Ref Rng & Units 02/10/2019  Glucose 70 - 99 mg/dL 94  BUN 6 - 20 mg/dL 12  Creatinine 02/12/2019 - 5.42 mg/dL 7.06  Sodium 2.37 - 628 mmol/L 138  Potassium 3.5 - 5.1 mmol/L 3.5  Chloride 98 - 111 mmol/L 110  CO2 22 - 32 mmol/L 21(L)  Calcium 8.9 - 10.3 mg/dL 315)  Total Protein 6.5 - 8.1 g/dL 6.8  Total Bilirubin 0.3 - 1.2 mg/dL 0.3  Alkaline Phos 38 - 126 U/L 64  AST 15 - 41 U/L 19  ALT 0 - 44 U/L 23   Edinburgh Postnatal Depression Scale Screening Tool 02/04/2020 02/04/2020  I have been able to laugh and see the funny side of things. 0 (No Data)  I have looked forward with enjoyment to things. 0 -  I have blamed myself unnecessarily when  things went wrong. 1 -  I have been anxious or worried for no good reason. 0 -  I have felt scared or panicky for no good reason. 0 -  Things have been getting on top of me. 0 -  I have been so unhappy that I have had difficulty sleeping. 0 -  I have felt sad or miserable. 0 -  I have been so unhappy that I have been crying. 0 -  The thought of harming myself has occurred to me. 0 -  Edinburgh Postnatal Depression Scale Total 1 -   Vaccines: TDaP          UTD         Flu             declined                    COVID-19 plans outpatient  Discharge instruction:  per After Visit Summary,  [facility] booklet and  "Understanding Mother & Baby Care" hospital booklet  After Visit Meds:  Allergies as of 02/05/2020      Reactions   Peanut-containing Drug Products Hives, Rash      Medication List    STOP  taking these medications   ondansetron 4 MG tablet Commonly known as: ZOFRAN     TAKE these medications   acetaminophen 500 MG tablet Commonly known as: TYLENOL Take 2 tablets (1,000 mg total) by mouth every 6 (six) hours as needed. What changed:   medication strength  how much to take   benzocaine-Menthol 20-0.5 % Aero Commonly known as: DERMOPLAST Apply 1 application topically as needed for irritation (perineal discomfort).   calcium carbonate 500 MG chewable tablet Commonly known as: TUMS - dosed in mg elemental calcium Chew 1 tablet by mouth daily.   coconut oil Oil Apply 1 application topically as needed.   dibucaine 1 % Oint Commonly known as: NUPERCAINAL Place 1 application rectally as needed for hemorrhoids.   famotidine 40 MG tablet Commonly known as: PEPCID Take 40 mg by mouth daily.   ibuprofen 600 MG tablet Commonly known as: ADVIL Take 1 tablet (600 mg total) by mouth every 6 (six) hours.   prenatal multivitamin Tabs tablet Take 1 tablet by mouth daily at 12 noon.   senna-docusate 8.6-50 MG tablet Commonly known as: Senokot-S Take 2 tablets by mouth daily. Start taking on: February 06, 2020   witch hazel-glycerin pad Commonly known as: TUCKS Apply 1 application topically as needed for hemorrhoids.            Discharge Care Instructions  (From admission, onward)         Start     Ordered   02/05/20 0000  Discharge wound care:       Comments: Sitz baths 2 times /day with warm water x 1 week. Luevenia add herbals: 1 ounce dried comfrey leaf* 1 ounce calendula flowers 1 ounce lavender flowers  Supplies can be found online at [employer] sources at [employer], [employer]  1/2 ounce dried uva ursi leaves 1/2 ounce witch hazel blossoms (if you can find them) 1/2 ounce dried sage leaf 1/2 cup sea salt Directions: Bring 2 quarts of water to a boil. Turn off heat, and place 1 ounce (approximately 1 large handful) of the above mixed  herbs (not the salt) into the pot. Steep, covered, for 30 minutes.  Strain the liquid well with a fine mesh strainer, and discard the herb material.  Add 2 quarts of liquid to the tub, along with the 1/2 cup of salt. This medicinal liquid can also be made into compresses and peri-rinses.   02/05/20 0848          Diet: routine diet  Activity: Advance as tolerated. Pelvic rest for 6 weeks.   Postpartum contraception: Not Discussed  Newborn Data: Live born female  Birth Weight: 6 lb 10.4 oz (3016 g) APGAR: 7, 9  Newborn Delivery   Birth date/time: 02/03/2020 23:42:00 Delivery type: Vaginal, Spontaneous      named [name] Baby Feeding: Bottle and Breast Disposition:home with mother   Delivery Report:  Review the Delivery Report for details.    Follow up:  Follow-up Information    June Leap, CNM. Schedule an appointment as soon as possible for a visit in 2 week(s).   Specialty: Certified Nurse Midwife Why: Please make an appointment for 2 weeks postpartum.  Contact information: [address] [place] [address] [phone]                 Signed: Cipriano Mile, MSN 02/05/2020, 8:50 AM

## 2020-02-08 ENCOUNTER — Encounter (HOSPITAL_COMMUNITY): Payer: Self-pay | Admitting: Obstetrics and Gynecology

## 2020-02-08 ENCOUNTER — Inpatient Hospital Stay (HOSPITAL_COMMUNITY)
Admission: AD | Admit: 2020-02-08 | Discharge: 2020-02-08 | Disposition: A | Discharge status: Home / Self Care | Payer: BC Managed Care – PPO | Source: Ambulatory Visit | Attending: Obstetrics and Gynecology | Admitting: Obstetrics and Gynecology

## 2020-02-08 ENCOUNTER — Other Ambulatory Visit: Payer: Self-pay

## 2020-02-08 DIAGNOSIS — Z79899 Other long term (current) drug therapy: Secondary | ICD-10-CM | POA: Insufficient documentation

## 2020-02-08 DIAGNOSIS — M79601 Pain in right arm: Secondary | ICD-10-CM | POA: Diagnosis not present

## 2020-02-08 DIAGNOSIS — R5383 Other fatigue: Secondary | ICD-10-CM | POA: Diagnosis not present

## 2020-02-08 DIAGNOSIS — O9089 Other complications of the puerperium, not elsewhere classified: Secondary | ICD-10-CM | POA: Diagnosis not present

## 2020-02-08 DIAGNOSIS — Z72820 Sleep deprivation: Secondary | ICD-10-CM | POA: Diagnosis not present

## 2020-02-08 DIAGNOSIS — M549 Dorsalgia, unspecified: Secondary | ICD-10-CM | POA: Insufficient documentation

## 2020-02-08 DIAGNOSIS — O99893 Other specified diseases and conditions complicating puerperium: Secondary | ICD-10-CM | POA: Diagnosis not present

## 2020-02-08 MED ORDER — DIPHENHYDRAMINE HCL 25 MG PO CAPS
25.0000 mg | ORAL_CAPSULE | Freq: Once | ORAL | Status: AC
  Administered 2020-02-08: 25 mg via ORAL
  Filled 2020-02-08: qty 1

## 2020-02-08 MED ORDER — CYCLOBENZAPRINE HCL 10 MG PO TABS: 10.0000 mg | ORAL_TABLET | Freq: Two times a day (BID) | ORAL | 0 refills | Status: DC | PRN

## 2020-02-08 MED ORDER — CYCLOBENZAPRINE HCL 5 MG PO TABS
10.0000 mg | ORAL_TABLET | Freq: Once | ORAL | Status: AC
  Administered 2020-02-08: 10 mg via ORAL
  Filled 2020-02-08: qty 2

## 2020-02-08 MED ORDER — ACETAMINOPHEN 500 MG PO TABS
1000.0000 mg | ORAL_TABLET | Freq: Once | ORAL | Status: AC
  Administered 2020-02-08: 1000 mg via ORAL
  Filled 2020-02-08: qty 2

## 2020-02-08 NOTE — MAU Note (Signed)
Pt reports having pain in R shoulder yesterday morning. She realized it when reaching for something. Felt sort of like muscle spasm. As day progressed pain increased and spread to L shoulder and back. About 1500 had ringing in both ears 1-99mins and subsided. At 2100 had severe itching lasting for to an hour and subsided on it's own. Did not have any rash or anything. Pt is very uncomfortable when moves. When putting on and removing b/p cuff caused much pain as did cuff tightening for b/p reading. States she can move arms but causes much pain. STates vag bleeding is ok. Her concern is shoulder and back pain. Cannot hold baby to breast feed

## 2020-02-08 NOTE — MAU Provider Note (Signed)
History     CSN: [id number]  Arrival date and time: 02/08/20 9528   First Provider Initiated Contact with Patient 02/08/20 0402      Chief Complaint  Patient presents with  . Back Pain  . Shoulder Pain   Ashley Fletcher is a 24 y.o. G1P1001 at .  She presents today for Back Pain and Shoulder Pain.  She states the pain started this morning at 11am.  She states it initially started on her right side when trying to reach for something on a shelf and it "felt like a sharp stabbing pain in my shoulder, clavicle area in the back."  She states the pain is constant, but worsened with movement.  Patient states she was taking ibuprofen today at 12 or 1am and tylenol yesterday at 3pm. She reports she has not taken any other medications and feels that her previous medications has not helped greatly. She states she has tried to massage the area and took a hot shower. Of note, patient appears extremely fatigued and reports she has had 2hrs of continuous sleep since delivery.    OB History    Gravida  1   Para  1   Term  1   Preterm      AB      Living  1     SAB      TAB      Ectopic      Multiple  0   Live Births  1           History reviewed. No pertinent past medical history.  History reviewed. No pertinent surgical history.  Family History  Problem Relation Age of Onset  . Hypertension Mother   . Diabetes Father   . Hyperlipidemia Father   . Hypertension Father     Social History   Tobacco Use  . Smoking status: Never Smoker  . Smokeless tobacco: Never Used  Vaping Use  . Vaping Use: Never used  Substance Use Topics  . Alcohol use: No  . Drug use: No    Allergies:  Allergies  Allergen Reactions  . Peanut-Containing Drug Products Hives and Rash    Medications Prior to Admission  Medication Sig Dispense Refill Last Dose  . acetaminophen (TYLENOL) 500 MG tablet Take 2 tablets (1,000 mg total) by mouth every 6 (six) hours as needed. 100 tablet 2  02/07/2020 at 1100  . benzocaine-Menthol (DERMOPLAST) 20-0.5 % AERO Apply 1 application topically as needed for irritation (perineal discomfort).   02/07/2020 at Unknown time  . ibuprofen (ADVIL) 600 MG tablet Take 1 tablet (600 mg total) by mouth every 6 (six) hours. 30 tablet 0 02/08/2020 at 0100  . Prenatal Vit-Fe Fumarate-FA (PRENATAL MULTIVITAMIN) TABS tablet Take 1 tablet by mouth daily at 12 noon.   02/07/2020 at Unknown time  . senna-docusate (SENOKOT-S) 8.6-50 MG tablet Take 2 tablets by mouth daily.   02/07/2020 at Unknown time  . calcium carbonate (TUMS - DOSED IN MG ELEMENTAL CALCIUM) 500 MG chewable tablet Chew 1 tablet by mouth daily.     . coconut oil OIL Apply 1 application topically as needed.  0   . dibucaine (NUPERCAINAL) 1 % OINT Place 1 application rectally as needed for hemorrhoids.     . famotidine (PEPCID) 40 MG tablet Take 40 mg by mouth daily.     Marland Kitchen witch hazel-glycerin (TUCKS) pad Apply 1 application topically as needed for hemorrhoids. 40 each 12     Review of Systems  Constitutional: Positive for fatigue. Negative for chills and fever.  Musculoskeletal: Positive for back pain. Negative for neck pain.   Physical Exam   Blood pressure 124/83, pulse 98, temperature 98.5 F (36.9 C), resp. rate 20, SpO2 100 %, currently breastfeeding.  Physical Exam Constitutional:      Appearance: Normal appearance.  Eyes:     Conjunctiva/sclera: Conjunctivae normal.  Cardiovascular:     Rate and Rhythm: Normal rate and regular rhythm.     Heart sounds: Normal heart sounds.  Pulmonary:     Effort: Pulmonary effort is normal. No respiratory distress.     Breath sounds: Normal breath sounds.  Chest:     Breasts:        Right: No bleeding.        Left: No bleeding.     Comments: Lactating.  Nipples intact.  Abdominal:     Palpations: Abdomen is soft.     Tenderness: There is no abdominal tenderness.  Musculoskeletal:     Right shoulder: Tenderness present. No swelling.  Decreased range of motion.     Left shoulder: Tenderness present. No swelling. Decreased range of motion.     Cervical back: Normal range of motion.  Skin:    General: Skin is warm and dry.  Neurological:     Mental Status: She is alert and oriented to person, place, and time.     MAU Course  Procedures No results found for this or any previous visit (from the past 24 hour(s)).  MDM Physical Exam Education Reassurance Consult Assessment and Plan  24 year old Postpartum State MS Pain Sleep Deprivation  -Patient informed that findings very suggestive of MS pain.  -Offered and accepts pain medication. -Discussed sleep deprivation state and how this is contributing to perception of pain. -Strongly encouraged and helped patient into bed.  Given warm blankets and instructed to rest. -Will give flexeril and tylenol now.  Cherre Robins 02/08/2020, 4:02 AM   Reassessment (5:07 AM)  -Radiologist, Mayford Knife, consulted regarding patient complaint and recommends XRay of shoulder.  -DG Shoulder Right Ordered. -Provider back to bedside to inform patient of plan and patient laying on right side! -Reports pain has improved immensely with flexeril dosing and rest. -Informed of radiology recommendation, but since patient showing improvement will cancel.  Patient agreeable. -Extensive discussion with patient regarding sleep deprivation and how it can contribute to exacerbation of symptoms and lead to depression as a new mom. -Patient verbalizes understanding and expresses appreciation for support offered today. -Discussed usage of flexeril for home use and benadryl prior to discharge to aid with sleep. -Patient agreeable and without further questions or concerns. -Encouraged to call or return to MAU if symptoms worsen or with the onset of new symptoms. -Discharged to home in improved condition.  Cherre Robins MSN, CNM Advanced Practice Provider, [facility]

## 2020-02-08 NOTE — Discharge Instructions (Signed)
Postpartum Baby Blues The postpartum period begins right after the birth of a baby. During this time, there is often a lot of joy and excitement. It is also a time of many changes in the life of the parents. No matter how many times a mother gives birth, each child brings new challenges to the family, including different ways of relating to one another. It is common to have feelings of excitement along with confusing changes in moods, emotions, and thoughts. You Ashley Fletcher feel happy one minute and sad or stressed the next. These feelings of sadness usually happen in the period right after you have your baby, and they go away within a week or two. This is called the "baby blues." What are the causes? There is no known cause of baby blues. It is likely caused by a combination of factors. However, changes in hormone levels after childbirth are believed to trigger some of the symptoms. Other factors that can play a role in these mood changes include:  Lack of sleep.  Stressful life events, such as poverty, caring for a loved one, or death of a loved one.  Genetics. What are the signs or symptoms? Symptoms of this condition include:  Brief changes in mood, such as going from extreme happiness to sadness.  Decreased concentration.  Difficulty sleeping.  Crying spells and tearfulness.  Loss of appetite.  Irritability.  Anxiety. If the symptoms of baby blues last for more than 2 weeks or become more severe, you Ashley Fletcher have postpartum depression. How is this diagnosed? This condition is diagnosed based on an evaluation of your symptoms. There are no medical or lab tests that lead to a diagnosis, but there are various questionnaires that a health care provider Ashley Fletcher use to identify women with the baby blues or postpartum depression. How is this treated? Treatment is not needed for this condition. The baby blues usually go away on their own in 1-2 weeks. Social support is often all that is needed. You will  be encouraged to get adequate sleep and rest. Follow these instructions at home: Lifestyle      Get as much rest as you can. Take a nap when the baby sleeps.  Exercise regularly as told by your health care provider. Some women find yoga and walking to be helpful.  Eat a balanced and nourishing diet. This includes plenty of fruits and vegetables, whole grains, and lean proteins.  Do little things that you enjoy. Have a cup of tea, take a bubble bath, read your favorite magazine, or listen to your favorite music.  Avoid alcohol.  Ask for help with household chores, cooking, grocery shopping, or running errands. Do not try to do everything yourself. Consider hiring a postpartum doula to help. This is a professional who specializes in providing support to new mothers.  Try not to make any major life changes during pregnancy or right after giving birth. This can add stress. General instructions  Talk to people close to you about how you are feeling. Get support from your partner, family members, friends, or other new moms. You Ashley Fletcher want to join a support group.  Find ways to cope with stress. This Ashley Fletcher include: ? Writing your thoughts and feelings in a journal. ? Spending time outside. ? Spending time with people who make you laugh.  Try to stay positive in how you think. Think about the things you are grateful for.  Take over-the-counter and prescription medicines only as told by your health care provider.    Let your health care provider know if you have any concerns.  Keep all postpartum visits as told by your health care provider. This is important. Contact a health care provider if:  Your baby blues do not go away after 2 weeks. Get help right away if:  You have thoughts of taking your own life (suicidal thoughts).  You think you Ashley Fletcher harm the baby or other people.  You see or hear things that are not there (hallucinations). Summary  After giving birth, you Ashley Fletcher feel happy  one minute and sad or stressed the next. Feelings of sadness that happen right after the baby is born and go away after a week or two are called the "baby blues."  You can manage the baby blues by getting enough rest, eating a healthy diet, exercising, spending time with supportive people, and finding ways to cope with stress.  If feelings of sadness and stress last longer than 2 weeks or get in the way of caring for your baby, talk to your health care provider. This Ashley Fletcher mean you have postpartum depression. This information is not intended to replace advice given to you by your health care provider. Make sure you discuss any questions you have with your health care provider. Document Revised: 06/25/2018 Document Reviewed: 04/29/2016 Elsevier Patient Education  2020 [employer] Inc[employer]  

## 2020-03-30 DIAGNOSIS — U071 COVID-19: Secondary | ICD-10-CM | POA: Diagnosis not present

## 2020-03-30 DIAGNOSIS — J029 Acute pharyngitis, unspecified: Secondary | ICD-10-CM | POA: Diagnosis not present

## 2020-03-30 DIAGNOSIS — R059 Cough, unspecified: Secondary | ICD-10-CM | POA: Diagnosis not present

## 2020-03-30 DIAGNOSIS — Z20822 Contact with and (suspected) exposure to covid-19: Secondary | ICD-10-CM | POA: Diagnosis not present

## 2020-04-10 DIAGNOSIS — M25552 Pain in left hip: Secondary | ICD-10-CM | POA: Diagnosis not present

## 2020-04-12 DIAGNOSIS — M545 Low back pain, unspecified: Secondary | ICD-10-CM | POA: Diagnosis not present

## 2020-04-17 DIAGNOSIS — L905 Scar conditions and fibrosis of skin: Secondary | ICD-10-CM | POA: Diagnosis not present

## 2020-04-19 DIAGNOSIS — Z113 Encounter for screening for infections with a predominantly sexual mode of transmission: Secondary | ICD-10-CM | POA: Diagnosis not present

## 2020-04-19 DIAGNOSIS — Z01411 Encounter for gynecological examination (general) (routine) with abnormal findings: Secondary | ICD-10-CM | POA: Diagnosis not present

## 2020-04-19 DIAGNOSIS — Z124 Encounter for screening for malignant neoplasm of cervix: Secondary | ICD-10-CM | POA: Diagnosis not present

## 2020-04-19 DIAGNOSIS — Z01419 Encounter for gynecological examination (general) (routine) without abnormal findings: Secondary | ICD-10-CM | POA: Diagnosis not present

## 2020-04-23 DIAGNOSIS — M5459 Other low back pain: Secondary | ICD-10-CM | POA: Diagnosis not present

## 2020-05-15 DIAGNOSIS — R197 Diarrhea, unspecified: Secondary | ICD-10-CM | POA: Diagnosis not present

## 2020-05-15 DIAGNOSIS — R0981 Nasal congestion: Secondary | ICD-10-CM | POA: Diagnosis not present

## 2020-05-15 DIAGNOSIS — Z20822 Contact with and (suspected) exposure to covid-19: Secondary | ICD-10-CM | POA: Diagnosis not present

## 2020-05-16 DIAGNOSIS — M9902 Segmental and somatic dysfunction of thoracic region: Secondary | ICD-10-CM | POA: Diagnosis not present

## 2020-05-16 DIAGNOSIS — M9905 Segmental and somatic dysfunction of pelvic region: Secondary | ICD-10-CM | POA: Diagnosis not present

## 2020-05-16 DIAGNOSIS — M9903 Segmental and somatic dysfunction of lumbar region: Secondary | ICD-10-CM | POA: Diagnosis not present

## 2020-05-16 DIAGNOSIS — M5417 Radiculopathy, lumbosacral region: Secondary | ICD-10-CM | POA: Diagnosis not present

## 2020-05-17 DIAGNOSIS — M5417 Radiculopathy, lumbosacral region: Secondary | ICD-10-CM | POA: Diagnosis not present

## 2020-05-17 DIAGNOSIS — M9903 Segmental and somatic dysfunction of lumbar region: Secondary | ICD-10-CM | POA: Diagnosis not present

## 2020-05-17 DIAGNOSIS — M9905 Segmental and somatic dysfunction of pelvic region: Secondary | ICD-10-CM | POA: Diagnosis not present

## 2020-05-17 DIAGNOSIS — M9902 Segmental and somatic dysfunction of thoracic region: Secondary | ICD-10-CM | POA: Diagnosis not present

## 2020-05-22 DIAGNOSIS — M9905 Segmental and somatic dysfunction of pelvic region: Secondary | ICD-10-CM | POA: Diagnosis not present

## 2020-05-22 DIAGNOSIS — M9903 Segmental and somatic dysfunction of lumbar region: Secondary | ICD-10-CM | POA: Diagnosis not present

## 2020-05-22 DIAGNOSIS — M9902 Segmental and somatic dysfunction of thoracic region: Secondary | ICD-10-CM | POA: Diagnosis not present

## 2020-05-22 DIAGNOSIS — M5417 Radiculopathy, lumbosacral region: Secondary | ICD-10-CM | POA: Diagnosis not present

## 2020-05-23 DIAGNOSIS — M9905 Segmental and somatic dysfunction of pelvic region: Secondary | ICD-10-CM | POA: Diagnosis not present

## 2020-05-23 DIAGNOSIS — M9903 Segmental and somatic dysfunction of lumbar region: Secondary | ICD-10-CM | POA: Diagnosis not present

## 2020-05-23 DIAGNOSIS — M9902 Segmental and somatic dysfunction of thoracic region: Secondary | ICD-10-CM | POA: Diagnosis not present

## 2020-05-23 DIAGNOSIS — M5417 Radiculopathy, lumbosacral region: Secondary | ICD-10-CM | POA: Diagnosis not present

## 2020-05-25 DIAGNOSIS — M9903 Segmental and somatic dysfunction of lumbar region: Secondary | ICD-10-CM | POA: Diagnosis not present

## 2020-05-25 DIAGNOSIS — M9905 Segmental and somatic dysfunction of pelvic region: Secondary | ICD-10-CM | POA: Diagnosis not present

## 2020-05-25 DIAGNOSIS — M5417 Radiculopathy, lumbosacral region: Secondary | ICD-10-CM | POA: Diagnosis not present

## 2020-05-25 DIAGNOSIS — M9902 Segmental and somatic dysfunction of thoracic region: Secondary | ICD-10-CM | POA: Diagnosis not present

## 2020-05-28 DIAGNOSIS — M9902 Segmental and somatic dysfunction of thoracic region: Secondary | ICD-10-CM | POA: Diagnosis not present

## 2020-05-28 DIAGNOSIS — M5417 Radiculopathy, lumbosacral region: Secondary | ICD-10-CM | POA: Diagnosis not present

## 2020-05-28 DIAGNOSIS — M9905 Segmental and somatic dysfunction of pelvic region: Secondary | ICD-10-CM | POA: Diagnosis not present

## 2020-05-28 DIAGNOSIS — M9903 Segmental and somatic dysfunction of lumbar region: Secondary | ICD-10-CM | POA: Diagnosis not present

## 2020-05-31 DIAGNOSIS — M9903 Segmental and somatic dysfunction of lumbar region: Secondary | ICD-10-CM | POA: Diagnosis not present

## 2020-05-31 DIAGNOSIS — M9905 Segmental and somatic dysfunction of pelvic region: Secondary | ICD-10-CM | POA: Diagnosis not present

## 2020-05-31 DIAGNOSIS — M9902 Segmental and somatic dysfunction of thoracic region: Secondary | ICD-10-CM | POA: Diagnosis not present

## 2020-05-31 DIAGNOSIS — M5417 Radiculopathy, lumbosacral region: Secondary | ICD-10-CM | POA: Diagnosis not present

## 2020-06-01 DIAGNOSIS — M5417 Radiculopathy, lumbosacral region: Secondary | ICD-10-CM | POA: Diagnosis not present

## 2020-06-01 DIAGNOSIS — M9903 Segmental and somatic dysfunction of lumbar region: Secondary | ICD-10-CM | POA: Diagnosis not present

## 2020-06-01 DIAGNOSIS — M9905 Segmental and somatic dysfunction of pelvic region: Secondary | ICD-10-CM | POA: Diagnosis not present

## 2020-06-01 DIAGNOSIS — M9902 Segmental and somatic dysfunction of thoracic region: Secondary | ICD-10-CM | POA: Diagnosis not present

## 2020-06-06 DIAGNOSIS — M9905 Segmental and somatic dysfunction of pelvic region: Secondary | ICD-10-CM | POA: Diagnosis not present

## 2020-06-06 DIAGNOSIS — M9903 Segmental and somatic dysfunction of lumbar region: Secondary | ICD-10-CM | POA: Diagnosis not present

## 2020-06-06 DIAGNOSIS — M9902 Segmental and somatic dysfunction of thoracic region: Secondary | ICD-10-CM | POA: Diagnosis not present

## 2020-06-06 DIAGNOSIS — M5417 Radiculopathy, lumbosacral region: Secondary | ICD-10-CM | POA: Diagnosis not present

## 2020-06-08 DIAGNOSIS — M9903 Segmental and somatic dysfunction of lumbar region: Secondary | ICD-10-CM | POA: Diagnosis not present

## 2020-06-08 DIAGNOSIS — M5417 Radiculopathy, lumbosacral region: Secondary | ICD-10-CM | POA: Diagnosis not present

## 2020-06-08 DIAGNOSIS — M9905 Segmental and somatic dysfunction of pelvic region: Secondary | ICD-10-CM | POA: Diagnosis not present

## 2020-06-08 DIAGNOSIS — M9902 Segmental and somatic dysfunction of thoracic region: Secondary | ICD-10-CM | POA: Diagnosis not present

## 2020-06-11 DIAGNOSIS — M9902 Segmental and somatic dysfunction of thoracic region: Secondary | ICD-10-CM | POA: Diagnosis not present

## 2020-06-11 DIAGNOSIS — M9905 Segmental and somatic dysfunction of pelvic region: Secondary | ICD-10-CM | POA: Diagnosis not present

## 2020-06-11 DIAGNOSIS — M9903 Segmental and somatic dysfunction of lumbar region: Secondary | ICD-10-CM | POA: Diagnosis not present

## 2020-06-11 DIAGNOSIS — M5417 Radiculopathy, lumbosacral region: Secondary | ICD-10-CM | POA: Diagnosis not present

## 2020-06-15 DIAGNOSIS — M9905 Segmental and somatic dysfunction of pelvic region: Secondary | ICD-10-CM | POA: Diagnosis not present

## 2020-06-15 DIAGNOSIS — M9902 Segmental and somatic dysfunction of thoracic region: Secondary | ICD-10-CM | POA: Diagnosis not present

## 2020-06-15 DIAGNOSIS — M9903 Segmental and somatic dysfunction of lumbar region: Secondary | ICD-10-CM | POA: Diagnosis not present

## 2020-06-15 DIAGNOSIS — M5417 Radiculopathy, lumbosacral region: Secondary | ICD-10-CM | POA: Diagnosis not present

## 2020-06-18 DIAGNOSIS — M9903 Segmental and somatic dysfunction of lumbar region: Secondary | ICD-10-CM | POA: Diagnosis not present

## 2020-06-18 DIAGNOSIS — M9902 Segmental and somatic dysfunction of thoracic region: Secondary | ICD-10-CM | POA: Diagnosis not present

## 2020-06-18 DIAGNOSIS — M5417 Radiculopathy, lumbosacral region: Secondary | ICD-10-CM | POA: Diagnosis not present

## 2020-06-18 DIAGNOSIS — M9905 Segmental and somatic dysfunction of pelvic region: Secondary | ICD-10-CM | POA: Diagnosis not present

## 2020-06-21 DIAGNOSIS — M9905 Segmental and somatic dysfunction of pelvic region: Secondary | ICD-10-CM | POA: Diagnosis not present

## 2020-06-21 DIAGNOSIS — M5417 Radiculopathy, lumbosacral region: Secondary | ICD-10-CM | POA: Diagnosis not present

## 2020-06-21 DIAGNOSIS — M9903 Segmental and somatic dysfunction of lumbar region: Secondary | ICD-10-CM | POA: Diagnosis not present

## 2020-06-21 DIAGNOSIS — M9902 Segmental and somatic dysfunction of thoracic region: Secondary | ICD-10-CM | POA: Diagnosis not present

## 2020-06-25 DIAGNOSIS — K6289 Other specified diseases of anus and rectum: Secondary | ICD-10-CM | POA: Diagnosis not present

## 2020-07-03 DIAGNOSIS — M9903 Segmental and somatic dysfunction of lumbar region: Secondary | ICD-10-CM | POA: Diagnosis not present

## 2020-07-03 DIAGNOSIS — M9905 Segmental and somatic dysfunction of pelvic region: Secondary | ICD-10-CM | POA: Diagnosis not present

## 2020-07-03 DIAGNOSIS — M9902 Segmental and somatic dysfunction of thoracic region: Secondary | ICD-10-CM | POA: Diagnosis not present

## 2020-07-03 DIAGNOSIS — M5417 Radiculopathy, lumbosacral region: Secondary | ICD-10-CM | POA: Diagnosis not present

## 2020-07-05 DIAGNOSIS — M79605 Pain in left leg: Secondary | ICD-10-CM | POA: Diagnosis not present

## 2020-07-05 DIAGNOSIS — M5459 Other low back pain: Secondary | ICD-10-CM | POA: Diagnosis not present

## 2020-07-10 ENCOUNTER — Ambulatory Visit
Admission: RE | Admit: 2020-07-10 | Discharge: 2020-07-10 | Disposition: A | Discharge status: Home / Self Care | Payer: BC Managed Care – PPO | Source: Ambulatory Visit | Attending: Sports Medicine | Admitting: Sports Medicine

## 2020-07-10 ENCOUNTER — Other Ambulatory Visit: Payer: Self-pay | Admitting: Sports Medicine

## 2020-07-10 DIAGNOSIS — M545 Low back pain, unspecified: Secondary | ICD-10-CM

## 2020-07-10 DIAGNOSIS — M48061 Spinal stenosis, lumbar region without neurogenic claudication: Secondary | ICD-10-CM | POA: Diagnosis not present

## 2020-07-13 ENCOUNTER — Other Ambulatory Visit: Payer: BC Managed Care – PPO

## 2020-07-25 DIAGNOSIS — M79605 Pain in left leg: Secondary | ICD-10-CM | POA: Diagnosis not present

## 2020-07-25 DIAGNOSIS — M5126 Other intervertebral disc displacement, lumbar region: Secondary | ICD-10-CM | POA: Diagnosis not present

## 2020-07-30 ENCOUNTER — Other Ambulatory Visit: Payer: Self-pay | Admitting: Sports Medicine

## 2020-07-30 DIAGNOSIS — M79605 Pain in left leg: Secondary | ICD-10-CM

## 2020-08-06 ENCOUNTER — Other Ambulatory Visit: Payer: BC Managed Care – PPO

## 2020-08-27 DIAGNOSIS — M545 Low back pain, unspecified: Secondary | ICD-10-CM | POA: Diagnosis not present

## 2020-08-27 DIAGNOSIS — M5416 Radiculopathy, lumbar region: Secondary | ICD-10-CM | POA: Diagnosis not present

## 2020-08-28 ENCOUNTER — Other Ambulatory Visit: Payer: BC Managed Care – PPO

## 2020-09-06 DIAGNOSIS — Z309 Encounter for contraceptive management, unspecified: Secondary | ICD-10-CM | POA: Diagnosis not present

## 2020-09-11 ENCOUNTER — Other Ambulatory Visit: Payer: Self-pay | Admitting: Orthopedic Surgery

## 2020-09-11 DIAGNOSIS — M79605 Pain in left leg: Secondary | ICD-10-CM

## 2020-09-12 DIAGNOSIS — J0100 Acute maxillary sinusitis, unspecified: Secondary | ICD-10-CM | POA: Diagnosis not present

## 2020-09-12 DIAGNOSIS — Z20822 Contact with and (suspected) exposure to covid-19: Secondary | ICD-10-CM | POA: Diagnosis not present

## 2020-09-13 ENCOUNTER — Other Ambulatory Visit: Payer: Self-pay

## 2020-09-13 ENCOUNTER — Ambulatory Visit
Admission: RE | Admit: 2020-09-13 | Discharge: 2020-09-13 | Disposition: A | Discharge status: Home / Self Care | Payer: BC Managed Care – PPO | Source: Ambulatory Visit | Attending: Sports Medicine | Admitting: Sports Medicine

## 2020-09-13 DIAGNOSIS — M5126 Other intervertebral disc displacement, lumbar region: Secondary | ICD-10-CM | POA: Diagnosis not present

## 2020-09-13 DIAGNOSIS — M79605 Pain in left leg: Secondary | ICD-10-CM

## 2020-09-13 MED ORDER — METHYLPREDNISOLONE ACETATE 40 MG/ML INJ SUSP (RADIOLOG
80.0000 mg | Freq: Once | INTRAMUSCULAR | Status: AC
  Administered 2020-09-13: 80 mg via EPIDURAL

## 2020-09-13 MED ORDER — IOPAMIDOL (ISOVUE-M 200) INJECTION 41%
1.0000 mL | Freq: Once | INTRAMUSCULAR | Status: AC
  Administered 2020-09-13: 1 mL via EPIDURAL

## 2020-09-13 NOTE — Discharge Instructions (Signed)
Post Procedure Spinal Discharge Instruction Sheet  You Delisia resume a regular diet and any medications that you routinely take (including pain medications) unless otherwise noted by MD.  No driving day of procedure.  Light activity throughout the rest of the day.  Do not do any strenuous work, exercise, bending or lifting.  The day following the procedure, you can resume normal physical activity but you should refrain from exercising or physical therapy for at least three days thereafter.  You Lerin apply ice to the injection site, 20 minutes on, 20 minutes off, as needed. Do not apply ice directly to skin.    Common Side Effects:  Headaches- take your usual medications as directed by your physician.  Increase your fluid intake.  Caffeinated beverages Shandiin be helpful.  Lie flat in bed until your headache resolves.  Restlessness or inability to sleep- you Marcheta have trouble sleeping for the next few days.  Ask your referring physician if you need any medication for sleep.  Facial flushing or redness- should subside within a few days.  Increased pain- a temporary increase in pain a day or two following your procedure is not unusual.  Take your pain medication as prescribed by your referring physician.  Leg cramps  Please contact our office at [phone] for the following symptoms: Fever greater than 100 degrees. Headaches unresolved with medication after 2-3 days. Increased swelling, pain, or redness at injection site.   Thank you for visiting [facility] today.   

## 2020-09-14 ENCOUNTER — Ambulatory Visit: Payer: Self-pay

## 2020-09-14 DIAGNOSIS — M5410 Radiculopathy, site unspecified: Secondary | ICD-10-CM | POA: Diagnosis not present

## 2020-09-14 DIAGNOSIS — M5127 Other intervertebral disc displacement, lumbosacral region: Secondary | ICD-10-CM | POA: Diagnosis not present

## 2020-09-14 NOTE — Telephone Encounter (Signed)
Reason for Disposition  [1] SEVERE back pain (e.g., excruciating, unable to do any normal activities) AND [2] not improved 2 hours after pain medicine  Answer Assessment - Initial Assessment Questions 1. ONSET: "When did the pain begin?"      Today 2. LOCATION: "Where does it hurt?" (upper, mid or lower back)     Low back and leg pain 3. SEVERITY: "How bad is the pain?"  (e.g., Scale 1-10; mild, moderate, or severe)   - MILD (1-3): doesn't interfere with normal activities    - MODERATE (4-7): interferes with normal activities or awakens from sleep    - SEVERE (8-10): excruciating pain, unable to do any normal activities      4. PATTERN: "Is the pain constant?" (e.g., yes, no; constant, intermittent)      Constant 5. RADIATION: "Does the pain shoot into your legs or elsewhere?"    Down leg  6. CAUSE:  "What do you think is causing the back pain?"      From procedure 7. BACK OVERUSE:  "Any recent lifting of heavy objects, strenuous work or exercise?"     Had procedure 8. MEDICATIONS: "What have you taken so far for the pain?" (e.g., nothing, acetaminophen, NSAIDS)     Motrin 9. NEUROLOGIC SYMPTOMS: "Do you have any weakness, numbness, or problems with bowel/bladder control?"     No 10. OTHER SYMPTOMS: "Do you have any other symptoms?" (e.g., fever, abdominal pain, burning with urination, blood in urine)       No 11. PREGNANCY: "Is there any chance you are pregnant?" (e.g., yes, no; LMP)       No  Protocols used: Back Pain-A-AH

## 2020-09-14 NOTE — Telephone Encounter (Signed)
Pt. Had a spinal injection yesterday. Having pain. She was told the pain would get worse before it got better. Can't take pain medicine because she is breast feeding. Will try ice packs or go to UC.

## 2020-09-18 DIAGNOSIS — M5416 Radiculopathy, lumbar region: Secondary | ICD-10-CM | POA: Diagnosis not present

## 2020-10-09 DIAGNOSIS — Z30430 Encounter for insertion of intrauterine contraceptive device: Secondary | ICD-10-CM | POA: Diagnosis not present

## 2020-10-16 ENCOUNTER — Ambulatory Visit: Payer: Self-pay | Admitting: Orthopedic Surgery

## 2020-11-07 NOTE — Progress Notes (Signed)
Surgical Instructions    Your procedure is scheduled on Thursday September 8th.  Report to [facility] Main Entrance "A" at 10:30 A.M., then check in with the Admitting office.  Call this number if you have problems the morning of surgery:  [phone]   If you have any questions prior to your surgery date call [phone]: Open Monday-Friday 8am-4pm    Remember:  Do not eat after midnight the night before your surgery  You Ashley Fletcher drink clear liquids until 9:30am the morning of your surgery.   Clear liquids allowed are: Water, Non-Citrus Juices (without pulp), Carbonated Beverages, Clear Tea, Black Coffee with (NO MILK, CREAM OR POWDERED CREAMER), and Gatorade    Take these medicines the morning of surgery with A SIP OF WATER IF NEEDED acetaminophen (TYLENOL) 500 MG tablet    As of today, STOP taking any Aspirin (unless otherwise instructed by your surgeon) Aleve, Naproxen, Ibuprofen, Motrin, Advil, Goody's, BC's, all herbal medications, fish oil, and all vitamins.          Do not wear jewelry or makeup Do not wear lotions, powders, perfumes, or deodorant. Do not shave 48 hours prior to surgery.   Do not bring valuables to the hospital. DO Not wear nail polish, gel polish, artificial nails, or any other type of covering on  natural nails including finger and toenails. If patients have artificial nails, gel coating, etc. that need to be removed by a nail salon please have this removed prior to surgery or surgery Ashley Fletcher need to be canceled/delayed if the surgeon/ anesthesia feels like the patient is unable to be adequately monitored.             Ashley Fletcher is not responsible for any belongings or valuables.  Do NOT Smoke (Tobacco/Vaping) or drink Alcohol 24 hours prior to your procedure If you use a CPAP at night, you Mahi bring all equipment for your overnight stay.   Contacts, glasses, dentures or bridgework Aleene not be worn into surgery, please bring cases for these belongings   For  patients admitted to the hospital, discharge time will be determined by your treatment team.   Patients discharged the day of surgery will not be allowed to drive home, and someone needs to stay with them for 24 hours.  ONLY 1 SUPPORT PERSON Ashley Fletcher BE PRESENT WHILE YOU ARE IN SURGERY. IF YOU ARE TO BE ADMITTED ONCE YOU ARE IN YOUR ROOM YOU WILL BE ALLOWED TWO (2) VISITORS.  Minor children Ashley Fletcher have two parents present. Special consideration for safety and communication needs will be reviewed on a case by case basis.  Special instructions:    Oral Hygiene is also important to reduce your risk of infection.  Remember - BRUSH YOUR TEETH THE MORNING OF SURGERY WITH YOUR REGULAR TOOTHPASTE   Ashley Fletcher- Preparing For Surgery  Before surgery, you can play an important role. Because skin is not sterile, your skin needs to be as free of germs as possible. You can reduce the number of germs on your skin by washing with CHG (chlorahexidine gluconate) Soap before surgery.  CHG is an antiseptic cleaner which kills germs and bonds with the skin to continue killing germs even after washing.     Please do not use if you have an allergy to CHG or antibacterial soaps. If your skin becomes reddened/irritated stop using the CHG.  Do not shave (including legs and underarms) for at least 48 hours prior to first CHG shower. It is OK to shave  your face.  Please follow these instructions carefully.     Shower the NIGHT BEFORE SURGERY and the MORNING OF SURGERY with CHG Soap.   If you chose to wash your hair, wash your hair first as usual with your normal shampoo. After you shampoo, rinse your hair and body thoroughly to remove the shampoo.  Then [employer] and genitals (private parts) with your normal soap and rinse thoroughly to remove soap.  After that Use CHG Soap as you would any other liquid soap. You can apply CHG directly to the skin and wash gently with a scrungie or a clean washcloth.   Apply the CHG Soap to  your body ONLY FROM THE NECK DOWN.  Do not use on open wounds or open sores. Avoid contact with your eyes, ears, mouth and genitals (private parts). Wash Face and genitals (private parts)  with your normal soap.   Wash thoroughly, paying special attention to the area where your surgery will be performed.  Thoroughly rinse your body with warm water from the neck down.  DO NOT shower/wash with your normal soap after using and rinsing off the CHG Soap.  Pat yourself dry with a CLEAN TOWEL.  Wear CLEAN PAJAMAS to bed the night before surgery  Place CLEAN SHEETS on your bed the night before your surgery  DO NOT SLEEP WITH PETS.   Day of Surgery:  Take a shower with CHG soap. Wear Clean/Comfortable clothing the morning of surgery Do not apply any deodorants/lotions.   Remember to brush your teeth WITH YOUR REGULAR TOOTHPASTE.   Please read over the following fact sheets that you were given.

## 2020-11-08 ENCOUNTER — Inpatient Hospital Stay (HOSPITAL_COMMUNITY)
Admission: RE | Admit: 2020-11-08 | Discharge: 2020-11-08 | Disposition: A | Discharge status: Home / Self Care | Payer: BC Managed Care – PPO | Source: Ambulatory Visit

## 2020-11-08 DIAGNOSIS — Z30431 Encounter for routine checking of intrauterine contraceptive device: Secondary | ICD-10-CM | POA: Diagnosis not present

## 2020-11-09 ENCOUNTER — Ambulatory Visit: Payer: Self-pay | Admitting: Orthopedic Surgery

## 2020-11-09 NOTE — H&P (Signed)
Subjective:   Massa is a pleasant 25 year old female who is relatively healthy and currently breast-feeding who has had back pain and radicular left leg pain Since February 2022. She has failed to improve despite appropriate conservative care including Chiropractic care, over-the-counter medications including NSAIDs, Tylenol and a recent L5 selective nerve root block. Despite this she has ongoing radicular leg pain that is interfering with her quality-of-life. We have discussed surgical intervention and its risks and benefits in addition to alternative treatment options and the patient has expressed a desire to move forward with surgery. She is scheduled for a left L4-5 discectomy on 11/22/2020 with Dr. Shon Baton at [facility].  Patient Active Problem List   Diagnosis Date Noted   SVD (spontaneous vaginal delivery) 11/19 02/04/2020   Third degree perineal laceration 02/04/2020   Postpartum care following vaginal delivery 11/19 02/04/2020   Normal labor 02/03/2020   No past medical history on file.  No past surgical history on file.  Current Outpatient Medications  Medication Sig Dispense Refill Last Dose   acetaminophen (TYLENOL) 500 MG tablet Take 2 tablets (1,000 mg total) by mouth every 6 (six) hours as needed. (Patient taking differently: Take 500 mg by mouth every 6 (six) hours as needed for moderate pain.) 100 tablet 2    ibuprofen (ADVIL) 200 MG tablet Take 400 mg by mouth every 6 (six) hours as needed for moderate pain.      paragard intrauterine copper IUD IUD 1 each by Intrauterine route once.      No current facility-administered medications for this visit.   Allergies  Allergen Reactions   Peanut-Containing Drug Products Hives and Rash    Social History   Tobacco Use   Smoking status: Never   Smokeless tobacco: Never  Substance Use Topics   Alcohol use: No    Family History  Problem Relation Age of Onset   Hypertension Mother    Diabetes Father    Hyperlipidemia  Father    Hypertension Father     Review of Systems Pertinent items are noted in HPI.  Objective:   Vitals: Ht: 5 ft 6 in 11/06/2020 04:10 pm Wt: 173.5 lbs 11/06/2020 04:30 pm 11/06/2020 04:30 pm BMI: 28 11/06/2020 04:30 pm BP: 118/78 11/06/2020 04:30 pm O2Sat: 97% 11/06/2020 04:30 pm  Clinical exam: Nyrie is a pleasant individual, who appears younger than their stated age. She is alert and orientated 3. No shortness of breath, chest pain. Abdomen is soft and non-tender, negative loss of bowel and bladder control, no rebound tenderness. Negative: skin lesions abrasions contusions Lungs: Clear to auscultation bilaterally Cardiac: Regular rate and rhythm no rubs gallops murmurs Peripheral pulses: 2+ dorsalis pedis/posterior tibialis pulses bilaterally. Compartment soft and nontender. Gait pattern: Normal Assistive devices: None Neuro: Positive left straight leg raise test with reproduction of L5 radicular pain. Positive decreased sensation and dysesthesias in the left L5 dermatome. 5/5 motor strength in the lower extremity bilaterally. Negative Babinski test, no clonus, symmetrical 2+ deep tendon reflexes at the knee and the Achilles. Musculoskeletal: Mild to moderate low back pain but severe radicular left leg pain. No hip, knee, ankle pain with isolated joint range of motion X-rays of the lumbar spine demonstrate no significant pathology. Normal sagittal alignment. No degenerative disc disease, no spondylolisthesis or scoliosis. Lumbar MRI: completed on 07/10/20 was reviewed: Last fully formed disc space is considered L5-S1. Based on this she has a central to the left disc herniation at L4-5 with impingement on the descending L5 nerve root. Mild  disc bulge at L3-4 but no significant stenosis. No fracture or abnormal marrow signal change. Remainder of the MRI is unremarkable.  Assessment:    Keyonda is a pleasant 25 year old female with a L4-5 HNP causing left L5 nerve root compression and  radicular left leg pain. This pain has been ongoing since February despite appropriate conservative care including over-the-counter medications, chiropractic care, and a recent L5 selective nerve root block. We have discussed risks and benefits of surgical intervention and patient has expressed a desire to move forward with surgery.  Plan:   Left L4-5 discectomy  Risks and benefits of surgery were discussed with the patient. These include: Infection, bleeding, death, stroke, paralysis, ongoing or worse pain, need for additional surgery, leak of spinal fluid, adjacent segment degeneration requiring additional surgery, post-operative hematoma formation that can result in neurological compromise and the need for urgent/emergent re-operation. Loss in bowel and bladder control. Injury to major vessels that could result in the need for urgent abdominal surgery to stop bleeding. Risk of deep venous thrombosis (DVT) and the need for additional treatment. Recurrent disc herniation resulting in the need for revision surgery, which could include fusion surgery (utilizing instrumentation such as pedicle screws and intervertebral cages). Additional risk: If instrumentation is used there is a risk of migration, or breakage of that hardware that could require additional surgery.  Patient is young and healthy without any medical conditions. We will proceed without primary care clearance.  Patient is not on any blood thinners. No aspirin products. She uses NSAIDs occasionally. She knows to avoid NSAIDs 7 days prior to surgery.  We have also discussed the post-operative recovery period to include: bathing/showering restrictions, wound healing, activity (and driving) restrictions, medications/pain mangement. I did discuss with the patient that she Mayar require a muscle relaxer and/or narcotic pain medication in the postoperative period this Nanda interact with her breast-feeding. I have told her to discuss this with her  pediatrician and get their recommendations. Certainly, if her pain is well controlled with Tylenol that she does not need the pain medications. We will also use intraoperative Exparel and steroid around the nerve in order to decrease her postoperative pain.  We have also discussed post-operative redflags to include: signs and symptoms of postoperative infection, DVT/PE.  Discharge instructions were reviewed with the patient. All questions were invited and answered  Follow-up: 2 weeks postop

## 2020-11-20 ENCOUNTER — Encounter (HOSPITAL_COMMUNITY)
Admission: RE | Admit: 2020-11-20 | Discharge: 2020-11-20 | Disposition: A | Discharge status: Home / Self Care | Payer: BC Managed Care – PPO | Source: Ambulatory Visit | Attending: Orthopedic Surgery | Admitting: Orthopedic Surgery

## 2020-11-20 ENCOUNTER — Other Ambulatory Visit: Payer: Self-pay

## 2020-11-20 ENCOUNTER — Encounter (HOSPITAL_COMMUNITY): Payer: Self-pay

## 2020-11-20 DIAGNOSIS — Z01812 Encounter for preprocedural laboratory examination: Secondary | ICD-10-CM | POA: Diagnosis not present

## 2020-11-20 LAB — BASIC METABOLIC PANEL
Anion gap: 8 (ref 5–15)
BUN: 9 mg/dL (ref 6–20)
CO2: 23 mmol/L (ref 22–32)
Calcium: 9.1 mg/dL (ref 8.9–10.3)
Chloride: 105 mmol/L (ref 98–111)
Creatinine, Ser: 0.48 mg/dL (ref 0.44–1.00)
GFR, Estimated: >60 mL/min (ref >60)
Glucose, Bld: 104 mg/dL — ABNORMAL HIGH (ref 70–99)
Potassium: 3.3 mmol/L — ABNORMAL LOW (ref 3.5–5.1)
Sodium: 136 mmol/L (ref 135–145)

## 2020-11-20 LAB — URINALYSIS, COMPLETE (UACMP) WITH MICROSCOPIC
Bilirubin Urine: NEGATIVE
Glucose, UA: NEGATIVE mg/dL
Hgb urine dipstick: NEGATIVE
Ketones, ur: NEGATIVE mg/dL
Leukocytes,Ua: NEGATIVE
Nitrite: NEGATIVE
Protein, ur: NEGATIVE mg/dL
Specific Gravity, Urine: >1.03 — ABNORMAL HIGH (ref 1.005–1.030)
pH: 6 (ref 5.0–8.0)

## 2020-11-20 LAB — SURGICAL PCR SCREEN
MRSA, PCR: NEGATIVE
Staphylococcus aureus: NEGATIVE

## 2020-11-20 LAB — CBC
HCT: 37 % (ref 36.0–46.0)
Hemoglobin: 12.1 g/dL (ref 12.0–15.0)
MCH: 27.2 pg (ref 26.0–34.0)
MCHC: 32.7 g/dL (ref 30.0–36.0)
MCV: 83.1 fL (ref 80.0–100.0)
Platelets: 288 10*3/uL (ref 150–400)
RBC: 4.45 MIL/uL (ref 3.87–5.11)
RDW: 13.9 % (ref 11.5–15.5)
WBC: 6.7 10*3/uL (ref 4.0–10.5)
nRBC: 0 % (ref 0.0–0.2)

## 2020-11-20 LAB — APTT: aPTT: 27 seconds (ref 24–36)

## 2020-11-20 LAB — PROTIME-INR
INR: 1 (ref 0.8–1.2)
Prothrombin Time: 13.1 seconds (ref 11.4–15.2)

## 2020-11-20 NOTE — Progress Notes (Signed)
PCP - [facility] [place] Cardiologist - denies  PPM/ICD - denies Device Orders - N/A Rep Notified - N/A  Chest x-ray - N/A EKG - N/A Stress Test - denies ECHO - denies Cardiac Cath - denies  Sleep Study - denies CPAP - N/A  Fasting Blood Sugar - N/A  Blood Thinner Instructions: N/A  Aspirin Instructions: Patient was instructed: As of today, STOP taking any Aspirin (unless otherwise instructed by your surgeon) Aleve, Naproxen, Ibuprofen, Motrin, Advil, Goody's, BC's, all herbal medications, fish oil, and all vitamins.  ERAS Protcol - yes PRE-SURGERY Ensure or G2- no  COVID TEST- no - ambulatory surgery  Anesthesia review: yes, per MD request  Patient denies shortness of breath, fever, cough and chest pain at PAT appointment   All instructions explained to the patient, with a verbal understanding of the material. Patient agrees to go over the instructions while at home for a better understanding. Patient also instructed to self quarantine after being tested for COVID-19. The opportunity to ask questions was provided.

## 2020-11-20 NOTE — Progress Notes (Signed)
Abnormal UA in PAT - Bacteria. UA - rare. Dr. Shon Baton office was called with this result and Valentino Saxon, RN was notified.

## 2020-11-22 ENCOUNTER — Observation Stay (HOSPITAL_COMMUNITY)
Admission: RE | Admit: 2020-11-22 | Discharge: 2020-11-22 | Disposition: A | Discharge status: Home / Self Care | Payer: BC Managed Care – PPO | Source: Ambulatory Visit | Attending: Orthopedic Surgery | Admitting: Orthopedic Surgery

## 2020-11-22 ENCOUNTER — Ambulatory Visit (HOSPITAL_COMMUNITY): Payer: BC Managed Care – PPO

## 2020-11-22 ENCOUNTER — Ambulatory Visit (HOSPITAL_COMMUNITY): Payer: BC Managed Care – PPO | Admitting: Physician Assistant

## 2020-11-22 ENCOUNTER — Ambulatory Visit (HOSPITAL_COMMUNITY)
Admission: RE | Disposition: A | Discharge status: Home / Self Care | Payer: Self-pay | Source: Ambulatory Visit | Attending: Orthopedic Surgery

## 2020-11-22 ENCOUNTER — Ambulatory Visit (HOSPITAL_COMMUNITY): Payer: BC Managed Care – PPO | Admitting: Anesthesiology

## 2020-11-22 ENCOUNTER — Encounter (HOSPITAL_COMMUNITY): Payer: Self-pay | Admitting: Orthopedic Surgery

## 2020-11-22 DIAGNOSIS — M5116 Intervertebral disc disorders with radiculopathy, lumbar region: Principal | ICD-10-CM | POA: Insufficient documentation

## 2020-11-22 DIAGNOSIS — Z419 Encounter for procedure for purposes other than remedying health state, unspecified: Secondary | ICD-10-CM

## 2020-11-22 DIAGNOSIS — Z981 Arthrodesis status: Secondary | ICD-10-CM | POA: Diagnosis not present

## 2020-11-22 DIAGNOSIS — M5126 Other intervertebral disc displacement, lumbar region: Secondary | ICD-10-CM | POA: Diagnosis present

## 2020-11-22 HISTORY — PX: LUMBAR LAMINECTOMY/DECOMPRESSION MICRODISCECTOMY: SHX5026

## 2020-11-22 LAB — POCT PREGNANCY, URINE: Preg Test, Ur: NEGATIVE

## 2020-11-22 SURGERY — LUMBAR LAMINECTOMY/DECOMPRESSION MICRODISCECTOMY 1 LEVEL
Anesthesia: General | Laterality: Left

## 2020-11-22 MED ORDER — THROMBIN (RECOMBINANT) 20000 UNITS EX SOLR
CUTANEOUS | Status: AC
  Filled 2020-11-22: qty 20000

## 2020-11-22 MED ORDER — SCOPOLAMINE 1 MG/3DAYS TD PT72: 1.0000 | MEDICATED_PATCH | TRANSDERMAL | Status: DC

## 2020-11-22 MED ORDER — PROPOFOL 10 MG/ML IV BOLUS
INTRAVENOUS | Status: DC | PRN
  Administered 2020-11-22: 120 mg via INTRAVENOUS

## 2020-11-22 MED ORDER — LIDOCAINE 2% (20 MG/ML) 5 ML SYRINGE
INTRAMUSCULAR | Status: DC | PRN
  Administered 2020-11-22: 60 mg via INTRAVENOUS

## 2020-11-22 MED ORDER — FENTANYL CITRATE (PF) 100 MCG/2ML IJ SOLN
INTRAMUSCULAR | Status: AC
  Filled 2020-11-22: qty 2

## 2020-11-22 MED ORDER — 0.9 % SODIUM CHLORIDE (POUR BTL) OPTIME
TOPICAL | Status: DC | PRN
  Administered 2020-11-22: 1000 mL

## 2020-11-22 MED ORDER — ROCURONIUM BROMIDE 10 MG/ML (PF) SYRINGE
PREFILLED_SYRINGE | INTRAVENOUS | Status: AC
  Filled 2020-11-22: qty 10

## 2020-11-22 MED ORDER — CHLORHEXIDINE GLUCONATE 0.12 % MT SOLN
15.0000 mL | OROMUCOSAL | Status: AC
  Filled 2020-11-22: qty 15

## 2020-11-22 MED ORDER — EPHEDRINE 5 MG/ML INJ
INTRAVENOUS | Status: AC
  Filled 2020-11-22: qty 5

## 2020-11-22 MED ORDER — FENTANYL CITRATE (PF) 250 MCG/5ML IJ SOLN
INTRAMUSCULAR | Status: DC | PRN
  Administered 2020-11-22 (×2): 50 ug via INTRAVENOUS
  Administered 2020-11-22 (×2): 100 ug via INTRAVENOUS

## 2020-11-22 MED ORDER — FENTANYL CITRATE (PF) 250 MCG/5ML IJ SOLN
INTRAMUSCULAR | Status: AC
  Filled 2020-11-22: qty 5

## 2020-11-22 MED ORDER — FENTANYL CITRATE (PF) 100 MCG/2ML IJ SOLN
25.0000 ug | INTRAMUSCULAR | Status: DC | PRN
  Administered 2020-11-22: 25 ug via INTRAVENOUS

## 2020-11-22 MED ORDER — BUPIVACAINE-EPINEPHRINE (PF) 0.25% -1:200000 IJ SOLN
INTRAMUSCULAR | Status: AC
  Filled 2020-11-22: qty 30

## 2020-11-22 MED ORDER — ROCURONIUM BROMIDE 10 MG/ML (PF) SYRINGE
PREFILLED_SYRINGE | INTRAVENOUS | Status: DC | PRN
  Administered 2020-11-22: 30 mg via INTRAVENOUS
  Administered 2020-11-22: 70 mg via INTRAVENOUS

## 2020-11-22 MED ORDER — BUPIVACAINE-EPINEPHRINE 0.25% -1:200000 IJ SOLN
INTRAMUSCULAR | Status: DC | PRN
  Administered 2020-11-22: 30 mL

## 2020-11-22 MED ORDER — DIPHENHYDRAMINE HCL 50 MG/ML IJ SOLN
INTRAMUSCULAR | Status: DC | PRN
  Administered 2020-11-22: 12.5 mg via INTRAVENOUS

## 2020-11-22 MED ORDER — APREPITANT 40 MG PO CAPS: 40.0000 mg | ORAL_CAPSULE | Freq: Once | ORAL | Status: DC

## 2020-11-22 MED ORDER — PHENYLEPHRINE HCL (PRESSORS) 10 MG/ML IV SOLN
INTRAVENOUS | Status: AC
  Filled 2020-11-22: qty 2

## 2020-11-22 MED ORDER — THROMBIN 20000 UNITS EX SOLR
CUTANEOUS | Status: DC | PRN
  Administered 2020-11-22: 20 mL via TOPICAL

## 2020-11-22 MED ORDER — METHYLPREDNISOLONE ACETATE 40 MG/ML INJ SUSP (RADIOLOG
INTRAMUSCULAR | Status: DC | PRN
  Administered 2020-11-22: 40 mg

## 2020-11-22 MED ORDER — LACTATED RINGERS IV SOLN: INTRAVENOUS | Status: DC

## 2020-11-22 MED ORDER — BUPIVACAINE LIPOSOME 1.3 % IJ SUSP
INTRAMUSCULAR | Status: DC | PRN
  Administered 2020-11-22: 20 mL

## 2020-11-22 MED ORDER — MIDAZOLAM HCL 2 MG/2ML IJ SOLN
INTRAMUSCULAR | Status: DC | PRN
  Administered 2020-11-22: 2 mg via INTRAVENOUS

## 2020-11-22 MED ORDER — SCOPOLAMINE 1 MG/3DAYS TD PT72
MEDICATED_PATCH | TRANSDERMAL | Status: AC
  Administered 2020-11-22: 1.5 mg via TRANSDERMAL
  Filled 2020-11-22: qty 1

## 2020-11-22 MED ORDER — HYDROCODONE-ACETAMINOPHEN 5-325 MG PO TABS: 1.0000 | ORAL_TABLET | Freq: Four times a day (QID) | ORAL | 0 refills | Status: AC | PRN

## 2020-11-22 MED ORDER — CEFAZOLIN SODIUM-DEXTROSE 2-4 GM/100ML-% IV SOLN
2.0000 g | INTRAVENOUS | Status: AC
  Administered 2020-11-22: 2 g via INTRAVENOUS

## 2020-11-22 MED ORDER — ACETAMINOPHEN 500 MG PO TABS
1000.0000 mg | ORAL_TABLET | Freq: Once | ORAL | Status: AC
  Administered 2020-11-22: 1000 mg via ORAL
  Filled 2020-11-22: qty 2

## 2020-11-22 MED ORDER — ONDANSETRON HCL 4 MG PO TABS: 4.0000 mg | ORAL_TABLET | Freq: Three times a day (TID) | ORAL | 0 refills | Status: DC | PRN

## 2020-11-22 MED ORDER — CEFAZOLIN SODIUM-DEXTROSE 2-4 GM/100ML-% IV SOLN
INTRAVENOUS | Status: AC
  Filled 2020-11-22: qty 100

## 2020-11-22 MED ORDER — SCOPOLAMINE 1 MG/3DAYS TD PT72
MEDICATED_PATCH | TRANSDERMAL | Status: AC
  Filled 2020-11-22: qty 1

## 2020-11-22 MED ORDER — PHENYLEPHRINE 40 MCG/ML (10ML) SYRINGE FOR IV PUSH (FOR BLOOD PRESSURE SUPPORT)
PREFILLED_SYRINGE | INTRAVENOUS | Status: DC | PRN
  Administered 2020-11-22: 80 ug via INTRAVENOUS
  Administered 2020-11-22: 160 ug via INTRAVENOUS

## 2020-11-22 MED ORDER — SUGAMMADEX SODIUM 200 MG/2ML IV SOLN
INTRAVENOUS | Status: DC | PRN
Start: 2020-11-22 — End: 2020-11-22
  Administered 2020-11-22: 200 mg via INTRAVENOUS

## 2020-11-22 MED ORDER — ONDANSETRON HCL 4 MG/2ML IJ SOLN
INTRAMUSCULAR | Status: DC | PRN
  Administered 2020-11-22: 4 mg via INTRAVENOUS

## 2020-11-22 MED ORDER — METHYLPREDNISOLONE ACETATE 40 MG/ML IJ SUSP
INTRAMUSCULAR | Status: AC
  Filled 2020-11-22: qty 1

## 2020-11-22 MED ORDER — MIDAZOLAM HCL 2 MG/2ML IJ SOLN
INTRAMUSCULAR | Status: AC
  Filled 2020-11-22: qty 2

## 2020-11-22 MED ORDER — CHLORHEXIDINE GLUCONATE 0.12 % MT SOLN
OROMUCOSAL | Status: AC
  Administered 2020-11-22: 15 mL via OROMUCOSAL
  Filled 2020-11-22: qty 15

## 2020-11-22 MED ORDER — DEXAMETHASONE SODIUM PHOSPHATE 10 MG/ML IJ SOLN
INTRAMUSCULAR | Status: DC | PRN
Start: 2020-11-22 — End: 2020-11-22
  Administered 2020-11-22: 10 mg via INTRAVENOUS

## 2020-11-22 MED ORDER — PHENYLEPHRINE HCL-NACL 20-0.9 MG/250ML-% IV SOLN
INTRAVENOUS | Status: DC | PRN
  Administered 2020-11-22: 25 ug/min via INTRAVENOUS

## 2020-11-22 MED ORDER — BUPIVACAINE LIPOSOME 1.3 % IJ SUSP
INTRAMUSCULAR | Status: AC
  Filled 2020-11-22: qty 20

## 2020-11-22 SURGICAL SUPPLY — 55 items
BAG COUNTER SPONGE SURGICOUNT (BAG) ×2 IMPLANT
BNDG GAUZE ELAST 4 BULKY (GAUZE/BANDAGES/DRESSINGS) ×2 IMPLANT
CANISTER SUCT 3000ML PPV (MISCELLANEOUS) ×2 IMPLANT
CLSR STERI-STRIP ANTIMIC 1/2X4 (GAUZE/BANDAGES/DRESSINGS) ×2 IMPLANT
CORD BIPOLAR FORCEPS 12FT (ELECTRODE) ×2 IMPLANT
COVER SURGICAL LIGHT HANDLE (MISCELLANEOUS) ×2 IMPLANT
DRAIN CHANNEL 15F RND FF W/TCR (WOUND CARE) IMPLANT
DRAPE POUCH INSTRU U-SHP 10X18 (DRAPES) ×2 IMPLANT
DRAPE SURG 17X23 STRL (DRAPES) ×2 IMPLANT
DRAPE U-SHAPE 47X51 STRL (DRAPES) ×2 IMPLANT
DRSG OPSITE POSTOP 3X4 (GAUZE/BANDAGES/DRESSINGS) ×2 IMPLANT
DRSG OPSITE POSTOP 4X6 (GAUZE/BANDAGES/DRESSINGS) ×2 IMPLANT
DURAPREP 26ML APPLICATOR (WOUND CARE) ×2 IMPLANT
ELECT BLADE 4.0 EZ CLEAN MEGAD (MISCELLANEOUS) ×2
ELECT CAUTERY BLADE 6.4 (BLADE) ×2 IMPLANT
ELECT PENCIL ROCKER SW 15FT (MISCELLANEOUS) ×2 IMPLANT
ELECT REM PT RETURN 9FT ADLT (ELECTROSURGICAL) ×2
ELECTRODE BLDE 4.0 EZ CLN MEGD (MISCELLANEOUS) ×1 IMPLANT
ELECTRODE REM PT RTRN 9FT ADLT (ELECTROSURGICAL) ×1 IMPLANT
EVACUATOR SILICONE 100CC (DRAIN) IMPLANT
GLOVE SURG ENC MOIS LTX SZ6.5 (GLOVE) ×2 IMPLANT
GLOVE SURG MICRO LTX SZ8.5 (GLOVE) ×2 IMPLANT
GLOVE SURG UNDER POLY LF SZ6.5 (GLOVE) ×2 IMPLANT
GLOVE SURG UNDER POLY LF SZ8.5 (GLOVE) ×2 IMPLANT
GOWN STRL REUS W/ TWL LRG LVL3 (GOWN DISPOSABLE) ×3 IMPLANT
GOWN STRL REUS W/TWL 2XL LVL3 (GOWN DISPOSABLE) ×2 IMPLANT
GOWN STRL REUS W/TWL LRG LVL3 (GOWN DISPOSABLE) ×6
KIT BASIN OR (CUSTOM PROCEDURE TRAY) ×2 IMPLANT
KIT TURNOVER KIT B (KITS) ×2 IMPLANT
NEEDLE 18GX1X1/2 (RX/OR ONLY) (NEEDLE) ×2 IMPLANT
NEEDLE 22X1 1/2 (OR ONLY) (NEEDLE) ×2 IMPLANT
NEEDLE SPNL 18GX3.5 QUINCKE PK (NEEDLE) ×4 IMPLANT
NS IRRIG 1000ML POUR BTL (IV SOLUTION) ×2 IMPLANT
PACK LAMINECTOMY ORTHO (CUSTOM PROCEDURE TRAY) ×2 IMPLANT
PACK UNIVERSAL I (CUSTOM PROCEDURE TRAY) ×2 IMPLANT
PAD ARMBOARD 7.5X6 YLW CONV (MISCELLANEOUS) IMPLANT
PATTIES SURGICAL .5 X.5 (GAUZE/BANDAGES/DRESSINGS) ×2 IMPLANT
PATTIES SURGICAL .5 X1 (DISPOSABLE) IMPLANT
SPONGE SURGIFOAM ABS GEL 100 (HEMOSTASIS) ×2 IMPLANT
SPONGE T-LAP 4X18 ~~LOC~~+RFID (SPONGE) ×4 IMPLANT
SURGIFLO W/THROMBIN 8M KIT (HEMOSTASIS) ×2 IMPLANT
SUT BONE WAX W31G (SUTURE) ×2 IMPLANT
SUT MNCRL AB 3-0 PS2 27 (SUTURE) ×2 IMPLANT
SUT VIC AB 0 CT1 27 (SUTURE)
SUT VIC AB 0 CT1 27XBRD ANBCTR (SUTURE) IMPLANT
SUT VIC AB 1 CT1 18XCR BRD 8 (SUTURE) ×1 IMPLANT
SUT VIC AB 1 CT1 8-18 (SUTURE) ×1
SUT VIC AB 2-0 CT1 18 (SUTURE) ×2 IMPLANT
SYR 3ML LL SCALE MARK (SYRINGE) ×2 IMPLANT
SYR BULB IRRIG 60ML STRL (SYRINGE) ×2 IMPLANT
SYR CONTROL 10ML LL (SYRINGE) ×2 IMPLANT
TOWEL GREEN STERILE (TOWEL DISPOSABLE) ×2 IMPLANT
TOWEL GREEN STERILE FF (TOWEL DISPOSABLE) ×2 IMPLANT
WATER STERILE IRR 1000ML POUR (IV SOLUTION) IMPLANT
YANKAUER SUCT BULB TIP NO VENT (SUCTIONS) ×2 IMPLANT

## 2020-11-22 NOTE — H&P (Signed)
Addendum HPI:  No change in clinical exam since last office visit of 11/09/20.  Patient continues to complain of radicular left leg pain due to HNP.  Plan on micro-disectomy.  Reviewed risks/benefits with patient and all questions addressed.  Patient expressed desire to move forward with surgery.

## 2020-11-22 NOTE — Anesthesia Procedure Notes (Addendum)
Procedure Name: Intubation Date/Time: 11/22/2020 2:27 PM Performed by: Renford Dills, CRNA Pre-anesthesia Checklist: Patient identified, Emergency Drugs available, Suction available and Patient being monitored Patient Re-evaluated:Patient Re-evaluated prior to induction Oxygen Delivery Method: Circle System Utilized Preoxygenation: Pre-oxygenation with 100% oxygen Induction Type: IV induction Ventilation: Mask ventilation without difficulty Laryngoscope Size: Miller and 2 Grade View: Grade I Tube type: Oral Tube size: 7.0 mm Number of attempts: 1 Airway Equipment and Method: Stylet and Oral airway Placement Confirmation: ETT inserted through vocal cords under direct vision, positive ETCO2 and breath sounds checked- equal and bilateral Secured at: 22 cm Tube secured with: Tape Dental Injury: Teeth and Oropharynx as per pre-operative assessment

## 2020-11-22 NOTE — Transfer of Care (Signed)
Immediate Anesthesia Transfer of Care Note  Patient: Ashley Fletcher  Procedure(s) Performed: LUMBAR LAMINECTOMY/DECOMPRESSION MICRODISCECTOMY 1 LEVEL (LEFT L4-5 DISCECTOMY) (Left)  Patient Location: PACU  Anesthesia Type:General  Level of Consciousness: awake, alert  and oriented  Airway & Oxygen Therapy: Patient Spontanous Breathing and Patient connected to nasal cannula oxygen  Post-op Assessment: Report given to RN and Post -op Vital signs reviewed and stable  Post vital signs: Reviewed and stable  Last Vitals:  Vitals Value Taken Time  BP 93/56 11/22/20 1629  Temp 36.6 C 11/22/20 1615  Pulse 69 11/22/20 1634  Resp 10 11/22/20 1634  SpO2 93 % 11/22/20 1634  Vitals shown include unvalidated device data.  Last Pain:  Vitals:   11/22/20 1615  TempSrc:   PainSc: 4       Patients Stated Pain Goal: 0 (11/22/20 1118)  Complications: No notable events documented.

## 2020-11-22 NOTE — Op Note (Signed)
OPERATIVE REPORT  DATE OF SURGERY: 11/22/2020  PATIENT NAME:  Ashley Fletcher MRN: [id number] DOB: May 11, 1995  PCP: [facility] [place]  PRE-OPERATIVE DIAGNOSIS: Left L4-5 disc herniation with L5 radiculopathy  POST-OPERATIVE DIAGNOSIS: Same  PROCEDURE:   Left L4-5 hemilaminotomy with medial facetectomy and foraminotomy for discectomy  SURGEON:  Venita Lick, MD  PHYSICIAN ASSISTANT: Voncille Lo, PA  ANESTHESIA:   General  EBL: 50 ml   Complications: None  BRIEF HISTORY: Ashley Fletcher is a 25 y.o. female who presented to my care with significant back buttock and radicular left leg pain.  Patient was diagnosed with a disc herniation with L5 radiculopathy.  Despite conservative care her quality of life is continued to deteriorate.  As result of the severity of her pain she elected to move forward with surgery.  All appropriate risks, benefits, and alternatives were discussed with the patient and consent was obtained  PROCEDURE DETAILS: Patient was brought into the operating room and was properly positioned on the operating room table.  After induction with general anesthesia the patient was endotracheally intubated.  A timeout was taken to confirm all important data: Including patient, procedure, and the level.  Teds, and SCDs were also applied.  Patient was prepped and draped in a standard fashion.  2 needles were placed into the back and an x-ray was taken to localize the skin incision.  I marked out the incision site and infiltrated with quarter percent Marcaine with epinephrine.  Incised the skin made sharp dissection down to the deep fascia.  Cerebellar retractor was placed to expose the deep fascia.  For postoperative analgesia I injected the paraspinal muscles with quarter percent Marcaine with epinephrine and Exparel.  I then incised the deep fascia and stripped the paraspinal muscles to expose the L4 and L5 spinous process as well as the L4 lamina and the  L4-5 facet capsule.  I then placed a Penfield 4 underneath the L4 lamina and took a second x-ray to confirm I was at the appropriate level.  This was read by the radiologist.  Using a 3 mm Kerrison rongeur I performed a hemilaminotomy of L4 and a medial facetectomy.  I then dissected through the ligamentum flavum and create a plane between the thecal sac and the ligamentum flavum and resected this with 3 and 2 mm Kerrison rongeur.  The L5 nerve root then became immediately visible as it was dorsally displaced and under significant compression.  A slightly wider medial facetectomy was performed in order to to place a Penfield 4 lateral to the nerve root and gently swept it medially.  Neuro patties were placed to maintain the exposure.  A large posterior lateral disc herniation came into view.  This appeared more significant than what was seen on her preoperative MRI.  A 15 blade scalpel was used to perform an annulotomy and I used a nerve hook to sweep circumferentially underneath the annulus and deliver 3 large fragments of disc material.  This was removed with a pituitary rondure.  I then swept again underneath the annulus moving across towards the midline to ensure I had adequate discectomy. Smaller fragments of disc material were removed from the intervertebral space with a micropituitary rondure.  At this point I could freely pass my [facility] underneath the thecal sac moving medially superiorly in the lateral recess and inferiorly out into the L5 foramen.  The L5 nerve root was no longer under compression and was now freely mobile in the lateral  recess and as it traveled around the pedicle.  At this point I was satisfied with the overall ectomy/decompression.  I irrigated wound copiously normal saline and obtain hemostasis using Floseal as well as bipolar electrocautery.  I irrigated the wound again and made 1 last check to confirm adequate decompression and there is no small fragments of disc  material.  I then placed 20 mg (1/2 cc) of Depo-Medrol over the exposed thecal sac and nerve root for improved postoperative analgesia.  Thrombin-soaked Gelfoam patty was then placed over the laminotomy site.  Retractors were removed and I closed the deep fascia with interrupted #1 Vicryl suture, then a layer 2-0 Vicryl suture, and finally a 3-0 Monocryl for the skin.  Steri-Strips and a dry dressing were applied and the patient was extubated and transferred the PACU without incident.  The end of the case all needle and sponge counts were correct.  There were no adverse intraoperative events.  Venita Lick, MD 11/22/2020 3:53 PM

## 2020-11-22 NOTE — Anesthesia Preprocedure Evaluation (Addendum)
Anesthesia Evaluation  Patient identified by MRN, date of birth, ID band Patient awake    Reviewed: Allergy & Precautions, NPO status , Patient's Chart, lab work & pertinent test results  Airway Mallampati: II  TM Distance: >3 FB Neck ROM: Full    Dental no notable dental hx. (+) Teeth Intact, Dental Advisory Given   Pulmonary neg pulmonary ROS,    Pulmonary exam normal breath sounds clear to auscultation       Cardiovascular negative cardio ROS Normal cardiovascular exam Rhythm:Regular Rate:Normal     Neuro/Psych negative neurological ROS  negative psych ROS   GI/Hepatic negative GI ROS, Neg liver ROS,   Endo/Other  negative endocrine ROS  Renal/GU negative Renal ROS  negative genitourinary   Musculoskeletal negative musculoskeletal ROS (+)   Abdominal   Peds  Hematology negative hematology ROS (+)   Anesthesia Other Findings   Reproductive/Obstetrics                            Anesthesia Physical Anesthesia Plan  ASA: 1  Anesthesia Plan: General   Post-op Pain Management:    Induction: Intravenous  PONV Risk Score and Plan: 3 and Midazolam, Dexamethasone, Ondansetron and Scopolamine patch - Pre-op  Airway Management Planned: Oral ETT  Additional Equipment:   Intra-op Plan:   Post-operative Plan: Extubation in OR  Informed Consent: I have reviewed the patients History and Physical, chart, labs and discussed the procedure including the risks, benefits and alternatives for the proposed anesthesia with the patient or authorized representative who has indicated his/her understanding and acceptance.     Dental advisory given  Plan Discussed with: CRNA  Anesthesia Plan Comments:        Anesthesia Quick Evaluation

## 2020-11-22 NOTE — Brief Op Note (Signed)
11/22/2020  4:02 PM  PATIENT:  Ashley Fletcher  25 y.o. female  PRE-OPERATIVE DIAGNOSIS:  Left L4-5 disc herniation with radiculopathy  POST-OPERATIVE DIAGNOSIS:  Left L4-5 disc herniation with radiculopathy  PROCEDURE:  Procedure(s): LUMBAR LAMINECTOMY/DECOMPRESSION MICRODISCECTOMY 1 LEVEL (LEFT L4-5 DISCECTOMY) (Left)  SURGEON:  Surgeon(s) and Role:    Venita Lick, MD - Primary  PHYSICIAN ASSISTANT:   ASSISTANTS: Amanda Ward, PA   ANESTHESIA:   general  EBL:  50 mL   BLOOD ADMINISTERED:none  DRAINS: none   LOCAL MEDICATIONS USED:  MARCAINE    and OTHER exparel  SPECIMEN:  No Specimen  DISPOSITION OF SPECIMEN:  N/A  COUNTS:  YES  TOURNIQUET:  * No tourniquets in log *  DICTATION: .Dragon Dictation  PLAN OF CARE: Admit for overnight observation  PATIENT DISPOSITION:  PACU - hemodynamically stable.

## 2020-11-22 NOTE — Discharge Instructions (Signed)
Laminectomy, Care After This sheet gives you information about how to care for yourself after your procedure. Your health care provider Ashley Fletcher also give you more specific instructions. If you have problems or questions, contact your health care provider. What can I expect after the procedure? After the procedure, it is common to have: Some pain around your incision area. Muscle tightening (spasms) across the back.   Follow these instructions at home: Incision care Follow instructions from your health care provider about how to take care of your incision area. Make sure you: Wash your hands with soap and water before and after you apply medicine to the area or change your bandage (dressing). If soap and water are not available, use hand sanitizer. Change your dressing as told by your health care provider. Leave stitches (sutures), skin glue, or adhesive strips in place. These skin closures Ashley Fletcher need to stay in place for 2 weeks or longer. If adhesive strip edges start to loosen and curl up, you Ashley Fletcher trim the loose edges. Do not remove adhesive strips completely unless your health care provider tells you to do that.  Check your incision area every day for signs of infection. Check for: More redness, swelling, or pain. More fluid or blood. Warmth. Pus or a bad smell. Medicines Take over-the-counter and prescription medicines only as told by your health care provider. If you were prescribed an antibiotic medicine, use it as told by your health care provider. Do not stop using the antibiotic even if you start to feel better. If needed, call office in 3 days to request refill of pain medications. Bathing Do not take baths, swim, or use a hot tub for 6 weeks, or until your incision has healed completely. If your health care provider approves, you Ashley Fletcher take showers after your dressing has been removed. Ok to shower in 5 days Activity Return to your normal activities as told by your health care  provider. Ask your health care provider what activities are safe for you. Avoid bending or twisting at your waist. Always bend at your knees. Do not sit for more than 20-30 minutes at a time. Lie down or walk between periods of sitting. Do not lift anything that is heavier than 10 lb (4.5 kg) or the limit that your health care provider tells you, until he or she says that it is safe. Do not drive for 2 weeks after your procedure or for as long as your health care provider tells you.  Do not drive or use heavy machinery while taking prescription pain medicine. General instructions To prevent or treat constipation while you are taking prescription pain medicine, your health care provider Ashley Fletcher recommend that you: Drink enough fluid to keep your urine clear or pale yellow. Take over-the-counter or prescription medicines. Eat foods that are high in fiber, such as fresh fruits and vegetables, whole grains, and beans. Limit foods that are high in fat and processed sugars, such as fried and sweet foods. Do breathing exercises as told. Keep all follow-up visits as told by your health care provider. This is important. Contact a health care provider if: You have more redness, swelling, or pain around your incision area. Your incision feels warm to the touch. You are not able to return to activities or do exercises as told by your health care provider. Get help right away if: You have: More fluid or blood coming from your incision area. Pus or a bad smell coming from your incision area. Chills or a fever.   Episodes of dizziness or fainting while standing. You develop a rash. You develop shortness of breath or you have difficulty breathing. You cannot control when you urinate or have a bowel movement. You become weak. You are not able to use your legs. Summary After the procedure, it is common to have some pain around your incision area. You Ashley Fletcher also have muscle tightening (spasms) across the  back. Follow instructions from your health care provider about how to care for your incision. Do not lift anything that is heavier than 10 lb (4.5 kg) or the limit that your health care provider tells you, until he or she says that it is safe. Contact your health care provider if you have more redness, swelling, or pain around your incision area or if your incision feels warm to the touch. These can be signs of infection. This information is not intended to replace advice given to you by your health care provider. Make sure you discuss any questions you have with your health care provider. Refer to this sheet in the next few weeks. These instructions provide you with information about caring for yourself after your procedure. Your health care provider Ashley Fletcher also give you more specific instructions. Your treatment has been planned according to current medical practices, but problems sometimes occur. Call your health care provider if you have any problems or questions after your procedure. What can I expect after the procedure? It is common to have pain for the first few days after the procedure. Some people continue to have mild pain even after making a full recovery. Follow these instructions at home: Medicine Take medicines only as directed by your health care provider. Avoid taking over-the-counter pain medicines unless your health care provider tells you otherwise. These medicines interfere with the development and growth of new bone cells. If you were prescribed a narcotic pain medicine, take it exactly as told by your health care provider. Do not drink alcohol while on the medicine. Do not drive while on the medicine. Injury care Care for your back brace as told by your health care provider. If directed, apply ice to the injured area: Put ice in a plastic bag. Place a towel between your skin and the bag. Leave the ice on for 20 minutes, 2-3 times a day. Activity Perform physical therapy  exercises as told by your health care provider. Exercise regularly. Start by taking short walks. Slowly increase your activity level over time. Gentle exercise helps to ease pain. Sit, stand, walk, turn in bed, and reposition yourself as told by your health care provider. This will help to keep your spine in proper alignment. Avoid bending and twisting your body. Avoid doing strenuous household chores, such as vacuuming. Do not lift anything that is heavier than 10 lb (4.5 kg). Other Instructions Keep all follow-up visits as directed by your health care provider. This is important. Do not use any tobacco products, including cigarettes, chewing tobacco, or electronic cigarettes. If you need help quitting, ask your health care provider. Nicotine affects the way bones heal. Contact a health care provider if: Your pain gets worse. You have a fever. You have redness, swelling, or pain at the site of your incision. You have fluid, blood, or pus coming from your incision. You have numbness, tingling, or weakness in any part of your body. Get help right away if: Your incision feels swollen and tender, and the surrounding area looks like a lump. The lump Ashley Fletcher be red or bluish in color. You   cannot move any part of your body (paralysis). You cannot control your bladder or bowels. 

## 2020-11-23 ENCOUNTER — Encounter (HOSPITAL_COMMUNITY): Payer: Self-pay | Admitting: Orthopedic Surgery

## 2020-11-23 MED FILL — Thrombin (Recombinant) For Soln 20000 Unit: CUTANEOUS | Qty: 1 | Status: AC

## 2020-11-23 NOTE — Anesthesia Postprocedure Evaluation (Signed)
Anesthesia Post Note  Patient: Ashley Fletcher  Procedure(s) Performed: LUMBAR LAMINECTOMY/DECOMPRESSION MICRODISCECTOMY 1 LEVEL (LEFT L4-5 DISCECTOMY) (Left)     Patient location during evaluation: PACU Anesthesia Type: General Level of consciousness: awake and alert Pain management: pain level controlled Vital Signs Assessment: post-procedure vital signs reviewed and stable Respiratory status: spontaneous breathing, nonlabored ventilation, respiratory function stable and patient connected to nasal cannula oxygen Cardiovascular status: blood pressure returned to baseline and stable Postop Assessment: no apparent nausea or vomiting Anesthetic complications: no   No notable events documented.  Last Vitals:  Vitals:   11/22/20 1645 11/22/20 1730  BP: 102/65 109/77  Pulse:  78  Resp:    Temp:  36.4 C  SpO2:  100%    Last Pain:  Vitals:   11/22/20 1715  TempSrc:   PainSc: 4                  Nicholas Ossa L Chace Bisch

## 2020-11-23 NOTE — Discharge Summary (Signed)
Patient ID: Ashley Fletcher MRN: [id number] DOB/AGE: 10-Sep-1995 25 y.o.  Admit date: 11/22/2020 Discharge date: 11/22/2020  Admission Diagnoses:  Active Problems:   Lumbar disc herniation   Discharge Diagnoses:  Active Problems:   Lumbar disc herniation  status post Procedure(s): LUMBAR LAMINECTOMY/DECOMPRESSION MICRODISCECTOMY 1 LEVEL (LEFT L4-5 DISCECTOMY)  History reviewed. No pertinent past medical history.  Surgeries: Procedure(s): LUMBAR LAMINECTOMY/DECOMPRESSION MICRODISCECTOMY 1 LEVEL (LEFT L4-5 DISCECTOMY) on 11/22/2020   Consultants:   Discharged Condition: Improved  Hospital Course: Ashley Fletcher is an 25 y.o. female who was admitted 11/22/2020 for operative treatment of Left L4-5 disc herniation with L5 radiculopathy. Patient failed conservative treatments (please see the history and physical for the specifics) and had severe unremitting pain that affects sleep, daily activities and work/hobbies. After pre-op clearance, the patient was taken to the operating room on 11/22/2020 and underwent  Procedure(s): LUMBAR LAMINECTOMY/DECOMPRESSION MICRODISCECTOMY 1 LEVEL (LEFT L4-5 DISCECTOMY).    Patient was given perioperative antibiotics:  Anti-infectives (From admission, onward)    Start     Dose/Rate Route Frequency Ordered Stop   11/22/20 1215  ceFAZolin (ANCEF) IVPB 2g/100 mL premix        2 g 200 mL/hr over 30 Minutes Intravenous 30 min pre-op 11/22/20 1101 11/22/20 1510   11/22/20 1108  ceFAZolin (ANCEF) 2-4 GM/100ML-% IVPB  Status:  Discontinued       Note to Pharmacy: Payton Emerald   : cabinet override      11/22/20 1108 11/22/20 2314        Patient was given sequential compression devices and early ambulation to prevent DVT.   Patient benefited maximally from hospital stay and there were no complications. At the time of discharge, the patient was urinating/moving their bowels without difficulty, tolerating a regular diet, pain is controlled with oral pain medications  and they have been cleared by PT/OT.   Recent vital signs: Patient Vitals for the past 24 hrs:  BP Temp Pulse Resp SpO2  11/22/20 1730 109/77 97.6 F (36.4 C) 78 -- 100 %  11/22/20 1645 102/65 -- -- -- --  11/22/20 1630 (!) 93/56 -- -- -- --  11/22/20 1629 (!) 93/56 -- 78 11 96 %  11/22/20 1615 -- 97.8 F (36.6 C) -- 18 100 %     Recent laboratory studies: No results for input(s): WBC, HGB, HCT, PLT, NA, K, CL, CO2, BUN, CREATININE, GLUCOSE, INR, CALCIUM in the last 72 hours.  Invalid input(s): PT, 2   Discharge Medications:   Allergies as of 11/22/2020       Reactions   Peanut-containing Drug Products Hives, Rash        Medication List     STOP taking these medications    ibuprofen 200 MG tablet Commonly known as: ADVIL   paragard intrauterine copper Iud IUD       TAKE these medications    acetaminophen 500 MG tablet Commonly known as: TYLENOL Take 2 tablets (1,000 mg total) by mouth every 6 (six) hours as needed. What changed:  how much to take reasons to take this   HYDROcodone-acetaminophen 5-325 MG tablet Commonly known as: Norco Take 1 tablet by mouth every 6 (six) hours as needed for up to 5 days.   ondansetron 4 MG tablet Commonly known as: Zofran Take 1 tablet (4 mg total) by mouth every 8 (eight) hours as needed for nausea or vomiting.        Diagnostic Studies: DG Lumbar Spine 2-3 Views  Result Date: 11/22/2020  CLINICAL DATA:  L4-L5 laminectomy EXAM: LUMBAR SPINE - 2-3 VIEW COMPARISON:  Portable cross-table lateral view at 1456 hours compared to MRI lumbar spine 07/10/2020 FINDINGS: Prior MR labeled with 5 lumbar vertebra. Current exam labeled accordingly. Tip of curved metallic probe projects dorsal to the L4-L5 disc space level IMPRESSION: Tip of curved metallic probe projects dorsal to the L4-L5 disc space level. Findings called to OR4 on 11/22/2020 at 1511 hours. Electronically Signed   By: Ulyses Southward M.D.   On: 11/22/2020 15:12     Discharge Instructions     Incentive spirometry RT   Complete by: As directed         Follow-up Information     Venita Lick, MD Follow up in 2 week(s).   Specialty: Orthopedic Surgery Why: If symptoms worsen, For suture removal, For wound re-check Contact information: [address] [place] [address] [phone]                 Discharge Plan:  discharge to home  Disposition: stable    Signed: Rhodia Albright for [facility] PA-C [facility] [phone] 11/23/2020, 3:40 PM

## 2020-11-29 ENCOUNTER — Other Ambulatory Visit: Payer: Self-pay | Admitting: Orthopedic Surgery

## 2020-11-29 DIAGNOSIS — M545 Low back pain, unspecified: Secondary | ICD-10-CM

## 2020-12-06 ENCOUNTER — Other Ambulatory Visit: Payer: Self-pay

## 2020-12-06 ENCOUNTER — Ambulatory Visit
Admission: RE | Admit: 2020-12-06 | Discharge: 2020-12-06 | Disposition: A | Discharge status: Home / Self Care | Payer: BC Managed Care – PPO | Source: Ambulatory Visit | Attending: Orthopedic Surgery | Admitting: Orthopedic Surgery

## 2020-12-06 DIAGNOSIS — M48061 Spinal stenosis, lumbar region without neurogenic claudication: Secondary | ICD-10-CM | POA: Diagnosis not present

## 2020-12-06 DIAGNOSIS — M545 Low back pain, unspecified: Secondary | ICD-10-CM

## 2020-12-06 DIAGNOSIS — M5126 Other intervertebral disc displacement, lumbar region: Secondary | ICD-10-CM | POA: Diagnosis not present

## 2020-12-06 MED ORDER — GADOBENATE DIMEGLUMINE 529 MG/ML IV SOLN
15.0000 mL | Freq: Once | INTRAVENOUS | Status: AC | PRN
  Administered 2020-12-06: 15 mL via INTRAVENOUS

## 2020-12-11 ENCOUNTER — Ambulatory Visit: Payer: Self-pay | Admitting: Orthopedic Surgery

## 2020-12-11 NOTE — H&P (Signed)
Subjective:   The patient is here today for follow-up of his left L4-5 discectomy.The patient is 2 weeks out from their surgery. She was initially doing well until 4 days after surgery he coughed began experiencing severe radicular left leg pain.  We did give her a steroid dose pack which did not significantly reduce her symptoms. The patient is having pain; 6-7/10 for LLE Recent PT: none The patient is using a brace/support. She denies any lower extremity weakness.  Denies any loss of control of her bladder or bowel function.  Denies any headaches.  Denies any fever/sweats/chills.  Denies any dizziness. As being using Tylenol for pain.  She is breast-feeding.  Patient Active Problem List   Diagnosis Date Noted   Lumbar disc herniation 11/22/2020   SVD (spontaneous vaginal delivery) 11/19 02/04/2020   Third degree perineal laceration 02/04/2020   Postpartum care following vaginal delivery 11/19 02/04/2020   Normal labor 02/03/2020   No past medical history on file.  Past Surgical History:  Procedure Laterality Date   LUMBAR LAMINECTOMY/DECOMPRESSION MICRODISCECTOMY Left 11/22/2020   Procedure: LUMBAR LAMINECTOMY/DECOMPRESSION MICRODISCECTOMY 1 LEVEL (LEFT L4-5 DISCECTOMY);  Surgeon: Venita Lick, MD;  Location: [facility] OR;  Service: Orthopedics;  Laterality: Left;    Current Outpatient Medications  Medication Sig Dispense Refill Last Dose   acetaminophen (TYLENOL) 500 MG tablet Take 2 tablets (1,000 mg total) by mouth every 6 (six) hours as needed. (Patient taking differently: Take 500 mg by mouth every 6 (six) hours as needed for moderate pain.) 100 tablet 2    ondansetron (ZOFRAN) 4 MG tablet Take 1 tablet (4 mg total) by mouth every 8 (eight) hours as needed for nausea or vomiting. 20 tablet 0    No current facility-administered medications for this visit.   Allergies  Allergen Reactions   Peanut-Containing Drug Products Hives and Rash    Social History   Tobacco Use   Smoking  status: Never   Smokeless tobacco: Never  Substance Use Topics   Alcohol use: No    Family History  Problem Relation Age of Onset   Hypertension Mother    Diabetes Father    Hyperlipidemia Father    Hypertension Father     Review of Systems Pertinent items are noted in HPI.  Objective:   General: AAOX3, well developed and well nourished, NAD Ambulation: normal gait pattern, uses no assistive device. Inspection: No obvious deformity Incision: C/D/I, no signs of infection. Monocryl knots intact and left in place.  AROM: - Knee: flexion and extension normal and pain free bilaterally. - Ankle: Dorsiflexion, plantarflexion, inversion, eversion normal and pain free. Dermatomes: Lower extremity sensation to light touch abnormal With positive pain and dysesthesias in the left L5 dermatome pattern Myotomes: - 5/5 lower from a motor strength bilaterally Reflexes: - Clonus: Negative Special Tests: - Straight Leg Raise: Left Positive, Right Negative  PV: Extremities warm and well profused.  MRI Impression: MRI of lumbar spine performed at [facility] 12/06/2020 the images as well as report were personally reviewed by me. At L4-5 there is an interval left laminectomy with associated fluid collection within the soft tissue posterior to L4-5 measuring 6.8 x 5.4 x 5.6 cm with possible left posterior lateral dural defect. Concerning for contained cerebral spinal fluid leak. At this level there is also a left paracentral disc extrusion at L4-5 resulting in severe canal stenosis left worse than right.   Assessment:    Ashley Fletcher is a pleasant 25 year old female who had an L5-S1 left-sided lumbar decompression  On 11/22/2020. There were no complications with surgery at the time and patient noted immediate relief of her radicular leg pain. She is taking Tylenol only for pain. Unfortunately, 4 days into her recovery she coughed and noted immediate back pain and radicular left leg pain. We have tried a  short course of steroids without any significant improvement and move forward with a MRI of her lumbar spine with contrast. Unfortunately this MRI demonstrated a recurrent left disc herniation at L4-5 rather large, in addition to the large contained Fluid accumulation consistent with CSF. Today, the patient continues to have radicular left leg pain. She is neurologically intact. She denies any loss of control of bladder or bowel function or saddle anesthesia or paresthesias. Denies any fever/sweats/chills. Denies any positional headaches.   Plan:   We will plan for repair of Dura and L4-5 left sided discectomy on Thursday. Dr. Franky Macho (neurosurgeon) will also be present for assistance.  Risks and benefits of surgery were discussed with the patient. These include: Infection, bleeding, death, stroke, paralysis, ongoing or worse pain, need for additional surgery, leak of spinal fluid, adjacent segment degeneration requiring additional surgery, post-operative hematoma formation that can result in neurological compromise and the need for urgent/emergent re-operation. Loss in bowel and bladder control. Injury to major vessels that could result in the need for urgent abdominal surgery to stop bleeding. Risk of deep venous thrombosis (DVT) and the need for additional treatment. Recurrent disc herniation resulting in the need for revision surgery, which could include fusion surgery (utilizing instrumentation such as pedicle screws and intervertebral cages).  Additional risk: If instrumentation is used there is a risk of migration, or breakage of that hardware that could require additional surgery.  Follow-up: 2 weeks postop  MRI report and patient were also examined by Dr. Shon Baton who is in agreement with this assessment and plan. He was able to also speak with the patient and her husband today and go over risks and benefits of surgery.

## 2020-12-12 ENCOUNTER — Encounter (HOSPITAL_COMMUNITY): Payer: Self-pay | Admitting: Orthopedic Surgery

## 2020-12-12 NOTE — Anesthesia Preprocedure Evaluation (Addendum)
Anesthesia Evaluation  Patient identified by MRN, date of birth, ID band Patient awake    Reviewed: Allergy & Precautions, NPO status , Patient's Chart, lab work & pertinent test results  History of Anesthesia Complications Negative for: history of anesthetic complications  Airway Mallampati: II  TM Distance: >3 FB Neck ROM: Full    Dental no notable dental hx.    Pulmonary neg pulmonary ROS,    Pulmonary exam normal        Cardiovascular negative cardio ROS Normal cardiovascular exam     Neuro/Psych Recurrent herniated disc L4-5 with cerebrospinal fluid leak negative psych ROS   GI/Hepatic negative GI ROS, Neg liver ROS,   Endo/Other  negative endocrine ROS  Renal/GU negative Renal ROS  negative genitourinary   Musculoskeletal negative musculoskeletal ROS (+)   Abdominal   Peds  Hematology negative hematology ROS (+)   Anesthesia Other Findings Day of surgery medications reviewed with patient.  Reproductive/Obstetrics negative OB ROS                            Anesthesia Physical Anesthesia Plan  ASA: 2  Anesthesia Plan: General   Post-op Pain Management:    Induction: Intravenous  PONV Risk Score and Plan: 4 or greater and Treatment Levada vary due to age or medical condition, Ondansetron, Dexamethasone, Midazolam and Propofol infusion  Airway Management Planned: Oral ETT  Additional Equipment: None  Intra-op Plan:   Post-operative Plan: Extubation in OR  Informed Consent: I have reviewed the patients History and Physical, chart, labs and discussed the procedure including the risks, benefits and alternatives for the proposed anesthesia with the patient or authorized representative who has indicated his/her understanding and acceptance.     Dental advisory given  Plan Discussed with: CRNA  Anesthesia Plan Comments:        Anesthesia Quick Evaluation

## 2020-12-12 NOTE — Progress Notes (Signed)
DUE TO COVID-19 ONLY ONE VISITOR IS ALLOWED TO COME WITH YOU AND STAY IN THE WAITING ROOM ONLY DURING PRE OP AND PROCEDURE DAY OF SURGERY.   Two  VISITORS  Ashley Fletcher VISIT WITH YOU AFTER SURGERY IN YOUR PRIVATE ROOM DURING VISITING HOURS ONLY!  PCP - [facility] [address] Cardiologist - n/a  Chest x-ray - n/a EKG - n/a Stress Test - n/a ECHO - n/a Cardiac Cath - n/a  ICD Pacemaker/Loop - n/a  Sleep Study -  n/a CPAP - none  STOP now taking any Aspirin (unless otherwise instructed by your surgeon), Aleve, Naproxen, Ibuprofen, Motrin, Advil, Goody's, BC's, all herbal medications, fish oil, and all vitamins.   Coronavirus Screening Covid test is scheduled on DOS. Do you have any of the following symptoms:  Cough yes/no: No Fever (>100.44F)  yes/no: No Runny nose yes/no: No Sore throat yes/no: No Difficulty breathing/shortness of breath  yes/no: No  Have you traveled in the last 14 days and where? yes/no: No  Patient verbalized understanding of instructions that were given via phone.

## 2020-12-13 ENCOUNTER — Encounter (HOSPITAL_COMMUNITY): Payer: Self-pay | Admitting: Orthopedic Surgery

## 2020-12-13 ENCOUNTER — Ambulatory Visit (HOSPITAL_COMMUNITY): Payer: BC Managed Care – PPO | Admitting: Anesthesiology

## 2020-12-13 ENCOUNTER — Other Ambulatory Visit: Payer: Self-pay

## 2020-12-13 ENCOUNTER — Observation Stay (HOSPITAL_COMMUNITY)
Admission: RE | Admit: 2020-12-13 | Discharge: 2020-12-13 | Disposition: A | Discharge status: Home / Self Care | Payer: BC Managed Care – PPO | Attending: Orthopedic Surgery | Admitting: Orthopedic Surgery

## 2020-12-13 ENCOUNTER — Ambulatory Visit (HOSPITAL_COMMUNITY): Payer: BC Managed Care – PPO

## 2020-12-13 ENCOUNTER — Ambulatory Visit (HOSPITAL_COMMUNITY)
Admission: RE | Disposition: A | Discharge status: Home / Self Care | Payer: Self-pay | Source: Home / Self Care | Attending: Orthopedic Surgery

## 2020-12-13 DIAGNOSIS — M5126 Other intervertebral disc displacement, lumbar region: Secondary | ICD-10-CM | POA: Diagnosis present

## 2020-12-13 DIAGNOSIS — Z20822 Contact with and (suspected) exposure to covid-19: Secondary | ICD-10-CM | POA: Insufficient documentation

## 2020-12-13 DIAGNOSIS — Z91010 Allergy to peanuts: Secondary | ICD-10-CM | POA: Diagnosis not present

## 2020-12-13 DIAGNOSIS — M5116 Intervertebral disc disorders with radiculopathy, lumbar region: Secondary | ICD-10-CM | POA: Diagnosis not present

## 2020-12-13 DIAGNOSIS — Z419 Encounter for procedure for purposes other than remedying health state, unspecified: Secondary | ICD-10-CM

## 2020-12-13 DIAGNOSIS — Z4789 Encounter for other orthopedic aftercare: Secondary | ICD-10-CM | POA: Diagnosis not present

## 2020-12-13 DIAGNOSIS — G9609 Other spinal cerebrospinal fluid leak: Secondary | ICD-10-CM | POA: Diagnosis not present

## 2020-12-13 HISTORY — PX: LUMBAR LAMINECTOMY/DECOMPRESSION MICRODISCECTOMY: SHX5026

## 2020-12-13 LAB — BASIC METABOLIC PANEL
Anion gap: 7 (ref 5–15)
BUN: 6 mg/dL (ref 6–20)
CO2: 23 mmol/L (ref 22–32)
Calcium: 9.2 mg/dL (ref 8.9–10.3)
Chloride: 108 mmol/L (ref 98–111)
Creatinine, Ser: 0.58 mg/dL (ref 0.44–1.00)
GFR, Estimated: >60 mL/min (ref >60)
Glucose, Bld: 94 mg/dL (ref 70–99)
Potassium: 3.6 mmol/L (ref 3.5–5.1)
Sodium: 138 mmol/L (ref 135–145)

## 2020-12-13 LAB — CBC
HCT: 38 % (ref 36.0–46.0)
Hemoglobin: 12.2 g/dL (ref 12.0–15.0)
MCH: 27.1 pg (ref 26.0–34.0)
MCHC: 32.1 g/dL (ref 30.0–36.0)
MCV: 84.3 fL (ref 80.0–100.0)
Platelets: 268 10*3/uL (ref 150–400)
RBC: 4.51 MIL/uL (ref 3.87–5.11)
RDW: 14.2 % (ref 11.5–15.5)
WBC: 5.4 10*3/uL (ref 4.0–10.5)
nRBC: 0 % (ref 0.0–0.2)

## 2020-12-13 LAB — PROTIME-INR
INR: 1 (ref 0.8–1.2)
Prothrombin Time: 13 seconds (ref 11.4–15.2)

## 2020-12-13 LAB — APTT: aPTT: 30 seconds (ref 24–36)

## 2020-12-13 LAB — SARS CORONAVIRUS 2 BY RT PCR (HOSPITAL ORDER, PERFORMED IN ~~LOC~~ HOSPITAL LAB): SARS Coronavirus 2: NEGATIVE

## 2020-12-13 SURGERY — LUMBAR LAMINECTOMY/DECOMPRESSION MICRODISCECTOMY 1 LEVEL
Anesthesia: General | Site: Spine Lumbar

## 2020-12-13 MED ORDER — LIDOCAINE HCL (PF) 2 % IJ SOLN
INTRAMUSCULAR | Status: AC
  Filled 2020-12-13: qty 5

## 2020-12-13 MED ORDER — FENTANYL CITRATE (PF) 250 MCG/5ML IJ SOLN
INTRAMUSCULAR | Status: DC | PRN
  Administered 2020-12-13 (×2): 100 ug via INTRAVENOUS

## 2020-12-13 MED ORDER — SODIUM CHLORIDE 0.9% FLUSH: 3.0000 mL | INTRAVENOUS | Status: DC | PRN

## 2020-12-13 MED ORDER — HYDROCODONE-ACETAMINOPHEN 5-325 MG PO TABS: 1.0000 | ORAL_TABLET | Freq: Four times a day (QID) | ORAL | 0 refills | Status: AC | PRN

## 2020-12-13 MED ORDER — OXYCODONE HCL 5 MG/5ML PO SOLN: 5.0000 mg | Freq: Once | ORAL | Status: DC | PRN

## 2020-12-13 MED ORDER — ONDANSETRON HCL 4 MG PO TABS: 4.0000 mg | ORAL_TABLET | Freq: Four times a day (QID) | ORAL | Status: DC | PRN

## 2020-12-13 MED ORDER — METHOCARBAMOL 1000 MG/10ML IJ SOLN: 500.0000 mg | Freq: Four times a day (QID) | INTRAVENOUS | Status: DC | PRN

## 2020-12-13 MED ORDER — CHLORHEXIDINE GLUCONATE 0.12 % MT SOLN: 15.0000 mL | Freq: Once | OROMUCOSAL | Status: AC

## 2020-12-13 MED ORDER — TRANEXAMIC ACID-NACL 1000-0.7 MG/100ML-% IV SOLN
INTRAVENOUS | Status: AC
  Filled 2020-12-13: qty 100

## 2020-12-13 MED ORDER — FENTANYL CITRATE (PF) 100 MCG/2ML IJ SOLN
INTRAMUSCULAR | Status: AC
  Filled 2020-12-13: qty 2

## 2020-12-13 MED ORDER — MIDAZOLAM HCL 2 MG/2ML IJ SOLN
INTRAMUSCULAR | Status: AC
  Filled 2020-12-13: qty 2

## 2020-12-13 MED ORDER — ONDANSETRON HCL 4 MG/2ML IJ SOLN
INTRAMUSCULAR | Status: AC
  Filled 2020-12-13: qty 2

## 2020-12-13 MED ORDER — THROMBIN 20000 UNITS EX SOLR
CUTANEOUS | Status: DC | PRN
  Administered 2020-12-13: 20 mL via TOPICAL

## 2020-12-13 MED ORDER — PROPOFOL 10 MG/ML IV BOLUS
INTRAVENOUS | Status: DC | PRN
  Administered 2020-12-13: 170 mg via INTRAVENOUS

## 2020-12-13 MED ORDER — SODIUM CHLORIDE 0.9 % IV SOLN: 250.0000 mL | INTRAVENOUS | Status: DC

## 2020-12-13 MED ORDER — CEFAZOLIN SODIUM-DEXTROSE 1-4 GM/50ML-% IV SOLN: 1.0000 g | Freq: Three times a day (TID) | INTRAVENOUS | Status: DC

## 2020-12-13 MED ORDER — ONDANSETRON HCL 4 MG PO TABS: 4.0000 mg | ORAL_TABLET | Freq: Three times a day (TID) | ORAL | 0 refills | Status: DC | PRN

## 2020-12-13 MED ORDER — DEXMEDETOMIDINE (PRECEDEX) IN NS 20 MCG/5ML (4 MCG/ML) IV SYRINGE
PREFILLED_SYRINGE | INTRAVENOUS | Status: AC
  Filled 2020-12-13: qty 5

## 2020-12-13 MED ORDER — DEXMEDETOMIDINE (PRECEDEX) IN NS 20 MCG/5ML (4 MCG/ML) IV SYRINGE
PREFILLED_SYRINGE | INTRAVENOUS | Status: DC | PRN
  Administered 2020-12-13 (×2): 8 ug via INTRAVENOUS

## 2020-12-13 MED ORDER — ROCURONIUM BROMIDE 10 MG/ML (PF) SYRINGE
PREFILLED_SYRINGE | INTRAVENOUS | Status: AC
  Filled 2020-12-13: qty 10

## 2020-12-13 MED ORDER — LACTATED RINGERS IV SOLN: INTRAVENOUS | Status: DC

## 2020-12-13 MED ORDER — OXYCODONE HCL 5 MG PO TABS
5.0000 mg | ORAL_TABLET | Freq: Once | ORAL | Status: DC | PRN
Start: 2020-12-13 — End: 2020-12-13

## 2020-12-13 MED ORDER — TRANEXAMIC ACID-NACL 1000-0.7 MG/100ML-% IV SOLN
INTRAVENOUS | Status: DC | PRN
  Administered 2020-12-13: 1000 mg via INTRAVENOUS

## 2020-12-13 MED ORDER — PROPOFOL 10 MG/ML IV BOLUS
INTRAVENOUS | Status: AC
  Filled 2020-12-13: qty 20

## 2020-12-13 MED ORDER — SUGAMMADEX SODIUM 200 MG/2ML IV SOLN
INTRAVENOUS | Status: DC | PRN
  Administered 2020-12-13: 200 mg via INTRAVENOUS

## 2020-12-13 MED ORDER — BUPIVACAINE-EPINEPHRINE 0.25% -1:200000 IJ SOLN
INTRAMUSCULAR | Status: DC | PRN
  Administered 2020-12-13: 20 mL

## 2020-12-13 MED ORDER — ROCURONIUM BROMIDE 10 MG/ML (PF) SYRINGE
PREFILLED_SYRINGE | INTRAVENOUS | Status: DC | PRN
  Administered 2020-12-13: 60 mg via INTRAVENOUS
  Administered 2020-12-13: 20 mg via INTRAVENOUS

## 2020-12-13 MED ORDER — CEFAZOLIN SODIUM-DEXTROSE 2-4 GM/100ML-% IV SOLN
2.0000 g | INTRAVENOUS | Status: AC
  Administered 2020-12-13: 2 g via INTRAVENOUS
  Filled 2020-12-13: qty 100

## 2020-12-13 MED ORDER — PHENOL 1.4 % MT LIQD: 1.0000 | OROMUCOSAL | Status: DC | PRN

## 2020-12-13 MED ORDER — DEXAMETHASONE SODIUM PHOSPHATE 10 MG/ML IJ SOLN
INTRAMUSCULAR | Status: DC | PRN
Start: 2020-12-13 — End: 2020-12-13
  Administered 2020-12-13: 10 mg via INTRAVENOUS

## 2020-12-13 MED ORDER — 0.9 % SODIUM CHLORIDE (POUR BTL) OPTIME
TOPICAL | Status: DC | PRN
  Administered 2020-12-13: 1000 mL

## 2020-12-13 MED ORDER — HYDROCODONE-ACETAMINOPHEN 5-325 MG PO TABS
1.0000 | ORAL_TABLET | ORAL | Status: DC | PRN
  Filled 2020-12-13: qty 1

## 2020-12-13 MED ORDER — ONDANSETRON HCL 4 MG/2ML IJ SOLN: 4.0000 mg | Freq: Four times a day (QID) | INTRAMUSCULAR | Status: DC | PRN

## 2020-12-13 MED ORDER — PROMETHAZINE HCL 25 MG/ML IJ SOLN: 6.2500 mg | INTRAMUSCULAR | Status: DC | PRN

## 2020-12-13 MED ORDER — HEMOSTATIC AGENTS (NO CHARGE) OPTIME
TOPICAL | Status: DC | PRN
  Administered 2020-12-13: 1 via TOPICAL

## 2020-12-13 MED ORDER — FENTANYL CITRATE (PF) 100 MCG/2ML IJ SOLN
25.0000 ug | INTRAMUSCULAR | Status: DC | PRN
  Administered 2020-12-13 (×3): 25 ug via INTRAVENOUS
  Administered 2020-12-13: 50 ug via INTRAVENOUS
  Administered 2020-12-13: 25 ug via INTRAVENOUS

## 2020-12-13 MED ORDER — PROPOFOL 1000 MG/100ML IV EMUL
INTRAVENOUS | Status: AC
  Filled 2020-12-13: qty 100

## 2020-12-13 MED ORDER — SODIUM CHLORIDE 0.9% FLUSH: 3.0000 mL | Freq: Two times a day (BID) | INTRAVENOUS | Status: DC

## 2020-12-13 MED ORDER — ACETAMINOPHEN 500 MG PO TABS
1000.0000 mg | ORAL_TABLET | Freq: Once | ORAL | Status: AC
  Administered 2020-12-13: 1000 mg via ORAL
  Filled 2020-12-13: qty 2

## 2020-12-13 MED ORDER — LIDOCAINE 2% (20 MG/ML) 5 ML SYRINGE
INTRAMUSCULAR | Status: DC | PRN
  Administered 2020-12-13: 100 mg via INTRAVENOUS

## 2020-12-13 MED ORDER — ONDANSETRON HCL 4 MG/2ML IJ SOLN
INTRAMUSCULAR | Status: DC | PRN
  Administered 2020-12-13: 4 mg via INTRAVENOUS

## 2020-12-13 MED ORDER — FENTANYL CITRATE (PF) 250 MCG/5ML IJ SOLN
INTRAMUSCULAR | Status: AC
  Filled 2020-12-13: qty 5

## 2020-12-13 MED ORDER — BUPIVACAINE-EPINEPHRINE (PF) 0.25% -1:200000 IJ SOLN
INTRAMUSCULAR | Status: AC
  Filled 2020-12-13: qty 30

## 2020-12-13 MED ORDER — THROMBIN 20000 UNITS EX KIT
PACK | CUTANEOUS | Status: AC
  Filled 2020-12-13: qty 1

## 2020-12-13 MED ORDER — MENTHOL 3 MG MT LOZG: 1.0000 | LOZENGE | OROMUCOSAL | Status: DC | PRN

## 2020-12-13 MED ORDER — MIDAZOLAM HCL 2 MG/2ML IJ SOLN
INTRAMUSCULAR | Status: DC | PRN
  Administered 2020-12-13: 2 mg via INTRAVENOUS

## 2020-12-13 MED ORDER — DEXAMETHASONE SODIUM PHOSPHATE 10 MG/ML IJ SOLN
INTRAMUSCULAR | Status: AC
  Filled 2020-12-13: qty 1

## 2020-12-13 MED ORDER — METHOCARBAMOL 500 MG PO TABS
500.0000 mg | ORAL_TABLET | Freq: Four times a day (QID) | ORAL | Status: DC | PRN
  Filled 2020-12-13: qty 1

## 2020-12-13 MED ORDER — ACETAMINOPHEN 650 MG RE SUPP: 650.0000 mg | RECTAL | Status: DC | PRN

## 2020-12-13 MED ORDER — ORAL CARE MOUTH RINSE: 15.0000 mL | Freq: Once | OROMUCOSAL | Status: AC

## 2020-12-13 MED ORDER — CHLORHEXIDINE GLUCONATE 0.12 % MT SOLN
OROMUCOSAL | Status: AC
  Administered 2020-12-13: 15 mL via OROMUCOSAL
  Filled 2020-12-13: qty 15

## 2020-12-13 MED ORDER — ACETAMINOPHEN 325 MG PO TABS
650.0000 mg | ORAL_TABLET | ORAL | Status: DC | PRN
  Administered 2020-12-13: 650 mg via ORAL
  Filled 2020-12-13: qty 2

## 2020-12-13 MED ORDER — SCOPOLAMINE 1 MG/3DAYS TD PT72
1.0000 | MEDICATED_PATCH | Freq: Once | TRANSDERMAL | Status: DC
  Administered 2020-12-13: 1.5 mg via TRANSDERMAL
  Filled 2020-12-13: qty 1

## 2020-12-13 MED ORDER — PROPOFOL 500 MG/50ML IV EMUL
INTRAVENOUS | Status: DC | PRN
  Administered 2020-12-13: 25 ug/kg/min via INTRAVENOUS

## 2020-12-13 SURGICAL SUPPLY — 59 items
BAG COUNTER SPONGE SURGICOUNT (BAG) ×2 IMPLANT
BNDG GAUZE ELAST 4 BULKY (GAUZE/BANDAGES/DRESSINGS) IMPLANT
BUR EGG ELITE 4.0 (BURR) IMPLANT
BUR MATCHSTICK NEURO 3.0 LAGG (BURR) IMPLANT
CANISTER SUCT 3000ML PPV (MISCELLANEOUS) ×2 IMPLANT
CLSR STERI-STRIP ANTIMIC 1/2X4 (GAUZE/BANDAGES/DRESSINGS) ×2 IMPLANT
CORD BIPOLAR FORCEPS 12FT (ELECTRODE) ×2 IMPLANT
COVER SURGICAL LIGHT HANDLE (MISCELLANEOUS) ×2 IMPLANT
DRAIN CHANNEL 15F RND FF W/TCR (WOUND CARE) IMPLANT
DRAPE POUCH INSTRU U-SHP 10X18 (DRAPES) ×2 IMPLANT
DRAPE SURG 17X23 STRL (DRAPES) ×2 IMPLANT
DRAPE U-SHAPE 47X51 STRL (DRAPES) ×2 IMPLANT
DRSG OPSITE POSTOP 3X4 (GAUZE/BANDAGES/DRESSINGS) IMPLANT
DRSG OPSITE POSTOP 4X6 (GAUZE/BANDAGES/DRESSINGS) ×2 IMPLANT
DURAPREP 26ML APPLICATOR (WOUND CARE) ×2 IMPLANT
DURASEAL APPLICATOR TIP (TIP) ×2 IMPLANT
DURASEAL SPINE SEALANT 3ML (MISCELLANEOUS) ×4 IMPLANT
ELECT BLADE 4.0 EZ CLEAN MEGAD (MISCELLANEOUS) ×2
ELECT CAUTERY BLADE 6.4 (BLADE) ×2 IMPLANT
ELECT PENCIL ROCKER SW 15FT (MISCELLANEOUS) ×2 IMPLANT
ELECT REM PT RETURN 9FT ADLT (ELECTROSURGICAL) ×2
ELECTRODE BLDE 4.0 EZ CLN MEGD (MISCELLANEOUS) ×1 IMPLANT
ELECTRODE REM PT RTRN 9FT ADLT (ELECTROSURGICAL) ×1 IMPLANT
EVACUATOR SILICONE 100CC (DRAIN) IMPLANT
GLOVE SURG ENC MOIS LTX SZ6.5 (GLOVE) ×2 IMPLANT
GLOVE SURG MICRO LTX SZ8.5 (GLOVE) ×2 IMPLANT
GLOVE SURG UNDER POLY LF SZ6.5 (GLOVE) ×2 IMPLANT
GLOVE SURG UNDER POLY LF SZ8.5 (GLOVE) ×2 IMPLANT
GOWN STRL REUS W/ TWL LRG LVL3 (GOWN DISPOSABLE) ×2 IMPLANT
GOWN STRL REUS W/TWL 2XL LVL3 (GOWN DISPOSABLE) ×2 IMPLANT
GOWN STRL REUS W/TWL LRG LVL3 (GOWN DISPOSABLE) ×4
KIT BASIN OR (CUSTOM PROCEDURE TRAY) ×2 IMPLANT
KIT TURNOVER KIT B (KITS) ×2 IMPLANT
NEEDLE 22X1 1/2 (OR ONLY) (NEEDLE) ×2 IMPLANT
NEEDLE SPNL 18GX3.5 QUINCKE PK (NEEDLE) ×4 IMPLANT
NS IRRIG 1000ML POUR BTL (IV SOLUTION) ×2 IMPLANT
PACK LAMINECTOMY ORTHO (CUSTOM PROCEDURE TRAY) ×2 IMPLANT
PACK UNIVERSAL I (CUSTOM PROCEDURE TRAY) ×2 IMPLANT
PAD ARMBOARD 7.5X6 YLW CONV (MISCELLANEOUS) ×4 IMPLANT
PATTIES SURGICAL .5 X.5 (GAUZE/BANDAGES/DRESSINGS) IMPLANT
PATTIES SURGICAL .5 X1 (DISPOSABLE) IMPLANT
POWDER MYRIAD MORCELLS 500MG (Miscellaneous) ×2 IMPLANT
SPONGE SURGIFOAM ABS GEL 100 (HEMOSTASIS) ×2 IMPLANT
SURGIFLO W/THROMBIN 8M KIT (HEMOSTASIS) ×2 IMPLANT
SUT BONE WAX W31G (SUTURE) ×2 IMPLANT
SUT ETHIBOND NAB CT1 #1 30IN (SUTURE) ×8 IMPLANT
SUT MNCRL AB 3-0 PS2 27 (SUTURE) ×2 IMPLANT
SUT PDS AB 2-0 CT2 27 (SUTURE) ×2 IMPLANT
SUT VIC AB 0 CT1 27 (SUTURE)
SUT VIC AB 0 CT1 27XBRD ANBCTR (SUTURE) IMPLANT
SUT VIC AB 1 CT1 18XCR BRD 8 (SUTURE) ×1 IMPLANT
SUT VIC AB 1 CT1 8-18 (SUTURE) ×2
SUT VIC AB 2-0 CT1 18 (SUTURE) ×2 IMPLANT
SYR BULB IRRIG 60ML STRL (SYRINGE) ×2 IMPLANT
SYR CONTROL 10ML LL (SYRINGE) ×2 IMPLANT
TOWEL GREEN STERILE (TOWEL DISPOSABLE) ×2 IMPLANT
TOWEL GREEN STERILE FF (TOWEL DISPOSABLE) ×2 IMPLANT
WATER STERILE IRR 1000ML POUR (IV SOLUTION) IMPLANT
YANKAUER SUCT BULB TIP NO VENT (SUCTIONS) ×2 IMPLANT

## 2020-12-13 NOTE — Anesthesia Postprocedure Evaluation (Signed)
Anesthesia Post Note  Patient: Whitlee M Ferns  Procedure(s) Performed: LUMBAR FOUR THROUGH FIVE LEFT REVISION DISCECTOMY WITH REPAIR OF CEREBRAL SPINAL FLUID LEAK (Spine Lumbar)     Patient location during evaluation: PACU Anesthesia Type: General Level of consciousness: awake and alert and oriented Pain management: pain level controlled Vital Signs Assessment: post-procedure vital signs reviewed and stable Respiratory status: spontaneous breathing, nonlabored ventilation and respiratory function stable Cardiovascular status: blood pressure returned to baseline Postop Assessment: no apparent nausea or vomiting Anesthetic complications: no   No notable events documented.  Last Vitals:  Vitals:   12/13/20 1225 12/13/20 1319  BP: (!) 103/52 (!) 102/48  Pulse: 69 84  Resp: 11 18  Temp:    SpO2: 100% 100%    Last Pain:  Vitals:   12/13/20 1225  TempSrc:   PainSc: 6                  Shanda Howells

## 2020-12-13 NOTE — Evaluation (Signed)
Occupational Therapy Evaluation Patient Details Name: Ashley Fletcher MRN: [id number] DOB: 1995/04/01 Today's Date: 12/13/2020   History of Present Illness 25 y.o. female presenting for elective L4-5 L revision discectomy with repair of CSF fluid leak. PMHx significant for lumbar disc hernia s/p L4-5 lumbar microdiscectomy 3 weeks ago.   Clinical Impression   PTA patient was living with her spouse, mother and grandmother in a 2nd floor apartment and was independent with ADLs/IADLs. Mother has been assisting with childcare since previous surgery. Patient currently presents near baseline level of function demonstrating observed ADLs with Mod I to supervision A with use of RW. OT provided education on spinal precautions, home set-up to maximize safety and independence with self-care tasks, and acquisition/use of AE. Patient expressed verbal understanding. Patient does not require continued acute occupational therapy services with OT to sign off at this time. D/c home with family pending for later today.      Recommendations for follow up therapy are one component of a multi-disciplinary discharge planning process, led by the attending physician.  Recommendations Khalani be updated based on patient status, additional functional criteria and insurance authorization.   Follow Up Recommendations  No OT follow up;Supervision - Intermittent    Equipment Recommendations  Other (comment) (rolling walker)    Recommendations for Other Services       Precautions / Restrictions Precautions Precautions: Back Precaution Booklet Issued: Yes (comment) Precaution Comments: Written and verbal education provided; patient expressed verbal understanding Required Braces or Orthoses: Spinal Brace Spinal Brace: Thoracolumbosacral orthotic;Other (comment) Spinal Brace Comments: Not present in room at time of entry. Husband to bring brace from home. Restrictions Weight Bearing Restrictions: No      Mobility Bed  Mobility                    Transfers Overall transfer level: Needs assistance Equipment used: Rolling walker (2 wheeled) Transfers: Sit to/from Stand Sit to Stand: Supervision         General transfer comment: Able to stand from EOB with supervision A and increased time. Cues for hand placement on RW.    Balance Overall balance assessment: Needs assistance Sitting-balance support: No upper extremity supported;Feet supported Sitting balance-Leahy Scale: Good     Standing balance support: During functional activity;Bilateral upper extremity supported Standing balance-Leahy Scale: Fair Standing balance comment: Prefers to steady with use of RW with mobility; able to maintain static standing balance without UE support.                           ADL either performed or assessed with clinical judgement   ADL Overall ADL's : Modified independent                                       General ADL Comments: Mod I with use of RW.     Vision Baseline Vision/History: 1 Wears glasses Ability to See in Adequate Light: 0 Adequate Patient Visual Report: No change from baseline Vision Assessment?: No apparent visual deficits     Perception     Praxis      Pertinent Vitals/Pain Pain Assessment: Faces Faces Pain Scale: Hurts even more Pain Location: Low back (incisional) Pain Descriptors / Indicators: Aching;Sharp;Moaning;Grimacing;Guarding Pain Intervention(s): Limited activity within patient's tolerance;Monitored during session;Repositioned     Hand Dominance Right   Extremity/Trunk Assessment Upper Extremity Assessment Upper Extremity  Assessment: Overall WFL for tasks assessed   Lower Extremity Assessment Lower Extremity Assessment: Overall WFL for tasks assessed   Cervical / Trunk Assessment Cervical / Trunk Assessment: Other exceptions Cervical / Trunk Exceptions: s/p spinal surgery   Communication     Cognition  Arousal/Alertness: Awake/alert Behavior During Therapy: WFL for tasks assessed/performed Overall Cognitive Status: Within Functional Limits for tasks assessed                                     General Comments  Clean, dry dressing at incision.    Exercises     Shoulder Instructions      Home Living Family/patient expects to be discharged to:: Private residence Living Arrangements: Spouse/significant other;Children;Other (Comment) (29 month old son; mother and grandmother) Available Help at Discharge: Family;Available 24 hours/day;Other (Comment) (Spouse works from home.) Type of Home: Apartment Home Access: Stairs to enter Secretary/administrator of Steps: 2 flights Entrance Stairs-Rails: Right;Left (Cannot reach both) Home Layout: One level               Home Equipment: None          Prior Functioning/Environment Level of Independence: Independent        Comments: I with ADLs/IADLs; mother assists with caring for son        OT Problem List: Pain      OT Treatment/Interventions:      OT Goals(Current goals can be found in the care plan section) Acute Rehab OT Goals Patient Stated Goal: To return home today. OT Goal Formulation: With patient  OT Frequency:     Barriers to D/C:            Co-evaluation              AM-PAC OT "6 Clicks" Daily Activity     Outcome Measure Help from another person eating meals?: None Help from another person taking care of personal grooming?: None Help from another person toileting, which includes using toliet, bedpan, or urinal?: A Little Help from another person bathing (including washing, rinsing, drying)?: A Little Help from another person to put on and taking off regular upper body clothing?: None Help from another person to put on and taking off regular lower body clothing?: None 6 Click Score: 22   End of Session Equipment Utilized During Treatment: Rolling walker Nurse Communication:  Mobility status  Activity Tolerance: Patient tolerated treatment well Patient left: in bed;with call bell/phone within reach  OT Visit Diagnosis: Pain Pain - part of body:  (low back)                Time: 9563-8756 OT Time Calculation (min): 21 min Charges:  OT General Charges $OT Visit: 1 Visit OT Evaluation $OT Eval Low Complexity: 1 Low  Lupita Rosales H. OTR/L Supplemental OT, Department of rehab services [phone]  Emin Foree R H. 12/13/2020, 3:02 PM

## 2020-12-13 NOTE — Op Note (Signed)
OPERATIVE REPORT  DATE OF SURGERY: 12/13/2020  PATIENT NAME:  Ashley Fletcher MRN: [id number] DOB: [birth year]  PCP: Kristen Loader, FNP  PRE-OPERATIVE DIAGNOSIS: Recurrent disc herniation with CSF leak L4-5  POST-OPERATIVE DIAGNOSIS: Recurrent disc herniation L4-5.  No identifiable CSF leak  PROCEDURE:   Revision L4-5 discectomy  SURGEON:  Melina Schools, MD  PHYSICIAN ASSISTANT: Nelson Chimes, PA  ANESTHESIA:   General  EBL: 30 ml   Complications: None  BRIEF HISTORY: Ashley Fletcher is a 25 y.o. female who had a lumbar microdiscectomy at L4-5 approximately 3 weeks ago.  Patient initially did quite well with complete resolution of her radicular leg pain.  Approximately 10 days ago she sneezed and had significant recurrent radicular leg pain.  As result of the recurrence of her leg pain we obtained a new MRI.  The MRI demonstrated a contained CSF leak as well as a large recurrent disc herniation.  As result of the findings we elected to return to the OR for revision discectomy and repair of CSF leak.  PROCEDURE DETAILS: Patient was brought into the operating room and was properly positioned on the operating room table.  After induction with general anesthesia the patient was endotracheally intubated.  A timeout was taken to confirm all important data: including patient, procedure, and the level. Teds, SCD's were applied.   She was turned prone onto the [place] frame.  All bony prominences well-padded.  Back was then prepped and draped in a standard fashion.  The previous incision was read incised and extended both cranially and caudally.  Deep dissection was taken down to the deep fascia at this point time did encounter the fluid collection that was seen on the preoperative MRI.  Upon gross inspection it did appear to be consistent with CSF fluid.  The sutures in the deep fascial were easily removed and I bluntly dissected down to the L4-5 facet complex.  I made a small incision on the right  side in order to place a distractor.  Self-retaining retractor was placed and excellent exposure of the L4-5 space.  The Gelfoam that was placed at the time of the index surgery was removed.  Using a Penfield 4 I was able to gently dissect into the lateral recess in order to expand the medial facetectomy.  I also extended the laminotomy cranially.  I was then able to palpate the L5 pedicle and ultimately visualized the L5 nerve root.  A large disc herniation was seen at the level of the disc space consistent with what was seen on the preoperative MRI.  Neuro patties were placed and I gently retracted the thecal sac in order to expose the disc herniation.  An annulotomy was performed with a 15 blade scalpel and then I used a series of nerve hooks to mobilize the disc fragment.  Ultimately a large fragment of disc material was removed en bloc consistent with the size of the MRI seen on the preoperative MRI.  At this point time the L5 nerve root was completely free of any compression/tension.  I was able to pass my nerve hook medially along the annulus inferiorly along the path of the L5 nerve root and superiorly towards the L4 foramen.  I also retracted the thecal sac and compressed it slightly and could not express CSF fluid.  At this point I was quite confident that we had removed the entire disc fragment and adequately decompressed the nerve root.  I then requested that colleague Dr. Dayton Bailiff (neurosurgeon)  scrubbed into the case to evaluate the wound.  He also mobilized the thecal sac and the nerve root compressed the thecal sac and could not express CSF fluid.  I then had the anesthesiologist perform a Valsalva to 40 mmHg and hold for 10 seconds as well as placed the patient in reverse Trendelenburg and no fluid was expressed.  The thecal sac itself was full providing adequate resistance.  At this point time I felt as though the CSF leak had sealed itself and was no longer an issue.  However as a precaution  I placed DuraSeal for additional protection against a latent CSF leak.  Hemostasis was obtained with bipolar electrocautery and Floseal as well as TXA.  The wound was copiously irrigated with normal saline.  I then closed the deep fascia with interrupted #1 Ethibond sutures.  We then placed myriad morcells in the wound to aid in wound closure and prevention of infection.  The superficial tissue was closed with interrupted 2-0 Vicryl sutures and then a 2-0 PDS suture was used to close the skin with a running subcuticular.  Steri-Strips and dry dressing were applied and the patient was ultimately extubated transfer the PACU without incident.  The end of the case all needle and sponge counts were correct.  There were no adverse intraoperative events.  Melina Schools, MD 12/13/2020 9:45 AM

## 2020-12-13 NOTE — Plan of Care (Addendum)
Pt and family given D/C instructions with verbal understanding. Rx's were given to the Pt @ D/C. Pt's incision is clean and dry with no sign of infection. Pt's IV was removed prior to D/C. Pt D/C'd home via walking per MD order. Pt is stable @ D/C and has no other needs at this time. Rema Fendt, RN

## 2020-12-13 NOTE — Progress Notes (Signed)
PT Cancellation Note  Patient Details Name: Ashley Fletcher MRN: [id number] DOB: [birth year]   Cancelled Treatment:    Reason Eval/Treat Not Completed: PT screened, no needs identified, will sign off Per OT, no skilled PT needs at this time. If needs change, please re-consult.   Farley Ly, PT, DPT  Acute Rehabilitation Services  Pager: [phone] Office: [phone]    Lehman Prom 12/13/2020, 3:09 PM

## 2020-12-13 NOTE — Anesthesia Procedure Notes (Signed)
Procedure Name: Intubation Date/Time: 12/13/2020 7:40 AM Performed by: Shary Decamp, CRNA Pre-anesthesia Checklist: Patient identified, Patient being monitored, Timeout performed, Emergency Drugs available and Suction available Patient Re-evaluated:Patient Re-evaluated prior to induction Oxygen Delivery Method: Circle System Utilized Preoxygenation: Pre-oxygenation with 100% oxygen Induction Type: IV induction Ventilation: Mask ventilation without difficulty Laryngoscope Size: Miller and 2 Grade View: Grade I Tube type: Oral Tube size: 7.0 mm Number of attempts: 1 Airway Equipment and Method: Stylet Placement Confirmation: ETT inserted through vocal cords under direct vision, positive ETCO2 and breath sounds checked- equal and bilateral Secured at: 21 cm Tube secured with: Tape Dental Injury: Teeth and Oropharynx as per pre-operative assessment

## 2020-12-13 NOTE — Brief Op Note (Signed)
12/13/2020  10:29 AM  PATIENT:  Ashley Fletcher  25 y.o. female  PRE-OPERATIVE DIAGNOSIS:  Recurrent herniated disc L4-5 with cerebrospinal fluid leak  POST-OPERATIVE DIAGNOSIS:  Recurrent herniated disc L4-5 with cerebrospinal fluid leak  PROCEDURE:  Procedure(s): LUMBAR FOUR THROUGH FIVE LEFT REVISION DISCECTOMY WITH REPAIR OF CEREBRAL SPINAL FLUID LEAK (N/A)  SURGEON:  Surgeon(s) and Role:    Venita Lick, MD - Primary    * Coletta Memos, MD  PHYSICIAN ASSISTANT:   ASSISTANTS: Voncille Lo, PA  ANESTHESIA:   general  EBL:  30 mL   BLOOD ADMINISTERED:none  DRAINS: none   LOCAL MEDICATIONS USED:  NONE  SPECIMEN:  No Specimen  DISPOSITION OF SPECIMEN:  N/A  COUNTS:  YES  TOURNIQUET:  * No tourniquets in log *  DICTATION: .Dragon Dictation  PLAN OF CARE: Admit for overnight observation  PATIENT DISPOSITION:  PACU - hemodynamically stable.

## 2020-12-13 NOTE — H&P (Signed)
Addendum H&P: Patient presents with recurrent radicular leg pain due to a disc herniation.  Patient had original discectomy approximately 3 weeks ago and had an excellent initial recovery.  She had complete resolution of her leg pain.  Approximately 10 days ago she sneezed and had increased radicular leg pain.  She denied any other symptoms other than recurrent radicular leg pain.  Repeat imaging demonstrated a recurrent disc herniation on the left side at L4-5 along with a CSF leak.  Patient had no signs or symptoms of a CSF leak.  The incision has remained intact.  As result of the imaging findings and her clinical exam we have elected to take her back to the operating room to remove the recurrent disc herniation and addressed the CSF leak.  I have discussed the risks, benefits, and alternatives to surgery with her in great detail and all of her questions were addressed.  Assisting need for the surgery be Dr. Barbaraann Barthel.  There has been no change in her clinical exam since her last office visit of 12/11/2020.

## 2020-12-13 NOTE — Progress Notes (Signed)
No pregnancy test needed per dr. Stephannie Peters.

## 2020-12-13 NOTE — Transfer of Care (Signed)
Immediate Anesthesia Transfer of Care Note  Patient: Ashley Fletcher  Procedure(s) Performed: LUMBAR FOUR THROUGH FIVE LEFT REVISION DISCECTOMY WITH REPAIR OF CEREBRAL SPINAL FLUID LEAK (Spine Lumbar)  Patient Location: PACU  Anesthesia Type:General  Level of Consciousness: sedated, patient cooperative and responds to stimulation  Airway & Oxygen Therapy: Patient Spontanous Breathing and Patient connected to nasal cannula oxygen  Post-op Assessment: Report given to RN and Post -op Vital signs reviewed and stable  Post vital signs: Reviewed and stable  Last Vitals:  Vitals Value Taken Time  BP 105/51 12/13/20 1053  Temp    Pulse 94 12/13/20 1054  Resp 21 12/13/20 1054  SpO2 100 % 12/13/20 1054  Vitals shown include unvalidated device data.  Last Pain:  Vitals:   12/13/20 0620  TempSrc:   PainSc: 6       Patients Stated Pain Goal: 3 (12/13/20 8315)  Complications: No notable events documented.

## 2020-12-13 NOTE — Discharge Instructions (Signed)
Laminectomy, Care After This sheet gives you information about how to care for yourself after your procedure. Your health care provider Aleiya also give you more specific instructions. If you have problems or questions, contact your health care provider. What can I expect after the procedure? After the procedure, it is common to have: Some pain around your incision area. Muscle tightening (spasms) across the back.   Follow these instructions at home: Incision care Follow instructions from your health care provider about how to take care of your incision area. Make sure you: Wash your hands with soap and water before and after you apply medicine to the area or change your bandage (dressing). If soap and water are not available, use hand sanitizer. Change your dressing as told by your health care provider. Leave stitches (sutures), skin glue, or adhesive strips in place. These skin closures Tandra need to stay in place for 2 weeks or longer. If adhesive strip edges start to loosen and curl up, you Nicolina trim the loose edges. Do not remove adhesive strips completely unless your health care provider tells you to do that.  Check your incision area every day for signs of infection. Check for: More redness, swelling, or pain. More fluid or blood. Warmth. Pus or a bad smell. Medicines Take over-the-counter and prescription medicines only as told by your health care provider. If you were prescribed an antibiotic medicine, use it as told by your health care provider. Do not stop using the antibiotic even if you start to feel better. If needed, call office in 3 days to request refill of pain medications. Bathing Do not take baths, swim, or use a hot tub for 6 weeks, or until your incision has healed completely. If your health care provider approves, you Alec take showers after your dressing has been removed. Ok to shower in 5 days Activity Return to your normal activities as told by your health care  provider. Ask your health care provider what activities are safe for you. Avoid bending or twisting at your waist. Always bend at your knees. Do not sit for more than 20-30 minutes at a time. Lie down or walk between periods of sitting. Do not lift anything that is heavier than 10 lb (4.5 kg) or the limit that your health care provider tells you, until he or she says that it is safe. Do not drive for 2 weeks after your procedure or for as long as your health care provider tells you.  Do not drive or use heavy machinery while taking prescription pain medicine. General instructions To prevent or treat constipation while you are taking prescription pain medicine, your health care provider Crystalmarie recommend that you: Drink enough fluid to keep your urine clear or pale yellow. Take over-the-counter or prescription medicines. Eat foods that are high in fiber, such as fresh fruits and vegetables, whole grains, and beans. Limit foods that are high in fat and processed sugars, such as fried and sweet foods. Do breathing exercises as told. Keep all follow-up visits as told by your health care provider. This is important. Contact a health care provider if: You have more redness, swelling, or pain around your incision area. Your incision feels warm to the touch. You are not able to return to activities or do exercises as told by your health care provider. Get help right away if: You have: More fluid or blood coming from your incision area. Pus or a bad smell coming from your incision area. Chills or a fever.   Episodes of dizziness or fainting while standing. You develop a rash. You develop shortness of breath or you have difficulty breathing. You cannot control when you urinate or have a bowel movement. You become weak. You are not able to use your legs. Summary After the procedure, it is common to have some pain around your incision area. You Charmian also have muscle tightening (spasms) across the  back. Follow instructions from your health care provider about how to care for your incision. Do not lift anything that is heavier than 10 lb (4.5 kg) or the limit that your health care provider tells you, until he or she says that it is safe. Contact your health care provider if you have more redness, swelling, or pain around your incision area or if your incision feels warm to the touch. These can be signs of infection. This information is not intended to replace advice given to you by your health care provider. Make sure you discuss any questions you have with your health care provider. Refer to this sheet in the next few weeks. These instructions provide you with information about caring for yourself after your procedure. Your health care provider Arantxa also give you more specific instructions. Your treatment has been planned according to current medical practices, but problems sometimes occur. Call your health care provider if you have any problems or questions after your procedure. What can I expect after the procedure? It is common to have pain for the first few days after the procedure. Some people continue to have mild pain even after making a full recovery. Follow these instructions at home: Medicine Take medicines only as directed by your health care provider. Avoid taking over-the-counter pain medicines unless your health care provider tells you otherwise. These medicines interfere with the development and growth of new bone cells. If you were prescribed a narcotic pain medicine, take it exactly as told by your health care provider. Do not drink alcohol while on the medicine. Do not drive while on the medicine. Injury care Care for your back brace as told by your health care provider. If directed, apply ice to the injured area: Put ice in a plastic bag. Place a towel between your skin and the bag. Leave the ice on for 20 minutes, 2-3 times a day. Activity Perform physical therapy  exercises as told by your health care provider. Exercise regularly. Start by taking short walks. Slowly increase your activity level over time. Gentle exercise helps to ease pain. Sit, stand, walk, turn in bed, and reposition yourself as told by your health care provider. This will help to keep your spine in proper alignment. Avoid bending and twisting your body. Avoid doing strenuous household chores, such as vacuuming. Do not lift anything that is heavier than 10 lb (4.5 kg). Other Instructions Keep all follow-up visits as directed by your health care provider. This is important. Do not use any tobacco products, including cigarettes, chewing tobacco, or electronic cigarettes. If you need help quitting, ask your health care provider. Nicotine affects the way bones heal. Contact a health care provider if: Your pain gets worse. You have a fever. You have redness, swelling, or pain at the site of your incision. You have fluid, blood, or pus coming from your incision. You have numbness, tingling, or weakness in any part of your body. Get help right away if: Your incision feels swollen and tender, and the surrounding area looks like a lump. The lump Collin be red or bluish in color. You   cannot move any part of your body (paralysis). You cannot control your bladder or bowels. 

## 2020-12-14 ENCOUNTER — Encounter (HOSPITAL_COMMUNITY): Payer: Self-pay | Admitting: Orthopedic Surgery

## 2020-12-14 MED FILL — Thrombin For Soln Kit 20000 Unit: CUTANEOUS | Qty: 1 | Status: AC

## 2020-12-16 ENCOUNTER — Other Ambulatory Visit: Payer: Self-pay

## 2020-12-16 ENCOUNTER — Emergency Department (HOSPITAL_COMMUNITY)
Admission: EM | Admit: 2020-12-16 | Discharge: 2020-12-17 | Disposition: A | Discharge status: Home / Self Care | Payer: BC Managed Care – PPO | Attending: Emergency Medicine | Admitting: Emergency Medicine

## 2020-12-16 DIAGNOSIS — L7621 Postprocedural hemorrhage and hematoma of skin and subcutaneous tissue following a dermatologic procedure: Secondary | ICD-10-CM | POA: Insufficient documentation

## 2020-12-16 DIAGNOSIS — Z5321 Procedure and treatment not carried out due to patient leaving prior to being seen by health care provider: Secondary | ICD-10-CM | POA: Diagnosis not present

## 2020-12-16 DIAGNOSIS — M79605 Pain in left leg: Secondary | ICD-10-CM | POA: Diagnosis not present

## 2020-12-16 NOTE — ED Notes (Signed)
Patient family upset over dressing not being cleaned and changed. Went to ask triage RN to come to talk to patient again but patient appears to have left.

## 2020-12-16 NOTE — ED Triage Notes (Signed)
Pt c/o bleeding noted at incision site from back surgery- discectomy w repair of cerebral spinal fluid leak 9/29. Today approx 1400, pt felt "warm fluid-like leaking on back," had family check & confirmed that site was bleeding. Original honeycomb dressing still in place, "unable to determine how bad it is." Pt reports no issues prior to 1400 today w healing.   10/10 shooting/piercing pain, soreness, shooting pain in L leg. States she's taking "barely anything d/t breastfeeding- tylenol & gabapentin, occasional oxycodone

## 2020-12-16 NOTE — ED Notes (Signed)
Pt offered pain medication, refused d/t lactation status. Advised to let staff know if pain changes

## 2020-12-17 NOTE — ED Notes (Addendum)
Pt family inquiring about wait time. Staff explained wait time, lab work and acuity. Pt see exiting the lobby.

## 2020-12-17 NOTE — Discharge Summary (Signed)
Patient ID: Ashley Fletcher MRN: [id number] DOB/AGE: 26-22-1997 26 y.o.  Admit date: 12/13/2020 Discharge date: 12/13/2020  Admission Diagnoses:  Active Problems:   Recurrent herniation of lumbar disc   Discharge Diagnoses:  Active Problems:   Recurrent herniation of lumbar disc  status post Procedure(s): LUMBAR FOUR THROUGH FIVE LEFT REVISION DISCECTOMY WITH REPAIR OF CEREBRAL SPINAL FLUID LEAK  History reviewed. No pertinent past medical history.  Surgeries: Procedure(s): LUMBAR FOUR THROUGH FIVE LEFT REVISION DISCECTOMY WITH REPAIR OF CEREBRAL SPINAL FLUID LEAK on 12/13/2020   Consultants:   Discharged Condition: Improved  Hospital Course: Ashley Fletcher is an 25 y.o. female who was admitted 12/13/2020 for operative treatment of Recurrent herniated disc L4-5 with cerebrospinal fluid leak. Patient failed conservative treatments (please see the history and physical for the specifics) and had severe unremitting pain that affects sleep, daily activities and work/hobbies. After pre-op clearance, the patient was taken to the operating room on 12/13/2020 and underwent  Procedure(s): LUMBAR FOUR THROUGH FIVE LEFT REVISION DISCECTOMY WITH REPAIR OF CEREBRAL SPINAL FLUID LEAK.    Patient was given perioperative antibiotics:  Anti-infectives (From admission, onward)    Start     Dose/Rate Route Frequency Ordered Stop   12/13/20 1530  ceFAZolin (ANCEF) IVPB 1 g/50 mL premix  Status:  Discontinued        1 g 100 mL/hr over 30 Minutes Intravenous Every 8 hours 12/13/20 1327 12/13/20 2249   12/13/20 0555  ceFAZolin (ANCEF) IVPB 2g/100 mL premix        2 g 200 mL/hr over 30 Minutes Intravenous 30 min pre-op 12/13/20 0555 12/13/20 0745        Patient was given sequential compression devices and early ambulation to prevent DVT.   Patient benefited maximally from hospital stay and there were no complications. At the time of discharge, the patient was urinating/moving their bowels without  difficulty, tolerating a regular diet, pain is controlled with oral pain medications and they have been cleared by PT/OT.   Recent vital signs: No data found.   Recent laboratory studies: No results for input(s): WBC, HGB, HCT, PLT, NA, K, CL, CO2, BUN, CREATININE, GLUCOSE, INR, CALCIUM in the last 72 hours.  Invalid input(s): PT, 2   Discharge Medications:   Allergies as of 12/13/2020       Reactions   Peanut-containing Drug Products Hives, Rash        Medication List     TAKE these medications    acetaminophen 500 MG tablet Commonly known as: TYLENOL Take 2 tablets (1,000 mg total) by mouth every 6 (six) hours as needed. What changed:  how much to take reasons to take this   HYDROcodone-acetaminophen 5-325 MG tablet Commonly known as: Norco Take 1 tablet by mouth every 6 (six) hours as needed for up to 5 days.   ondansetron 4 MG tablet Commonly known as: Zofran Take 1 tablet (4 mg total) by mouth every 8 (eight) hours as needed for nausea or vomiting.        Diagnostic Studies: DG Lumbar Spine 2-3 Views  Result Date: 11/22/2020 CLINICAL DATA:  L4-L5 laminectomy EXAM: LUMBAR SPINE - 2-3 VIEW COMPARISON:  Portable cross-table lateral view at 1456 hours compared to MRI lumbar spine 07/10/2020 FINDINGS: Prior MR labeled with 5 lumbar vertebra. Current exam labeled accordingly. Tip of curved metallic probe projects dorsal to the L4-L5 disc space level IMPRESSION: Tip of curved metallic probe projects dorsal to the L4-L5 disc space level. Findings called to [facility]  on 11/22/2020 at 1511 hours. Electronically Signed   By: Ulyses Southward M.D.   On: 11/22/2020 15:12   MR Lumbar Spine W Wo Contrast  Result Date: 12/06/2020 CLINICAL DATA:  Low back pain since January of 2022. History of L4-5 laminectomy and microdiscectomy on 11/22/2020 EXAM: MRI LUMBAR SPINE WITHOUT AND WITH CONTRAST TECHNIQUE: Multiplanar and multiecho pulse sequences of the lumbar spine were obtained without and with  intravenous contrast. CONTRAST:  10mL MULTIHANCE GADOBENATE DIMEGLUMINE 529 MG/ML IV SOLN COMPARISON:  MRI 07/10/2020 FINDINGS: Segmentation: Transitional lumbosacral anatomy was described on the previous MRI report. In keeping with the establish numbering convention, the lowest well developed disc space is designated as L5-S1. Alignment:  Physiologic. Vertebrae:  No fracture, evidence of discitis, or bone lesion. Conus medullaris and cauda equina: Conus extends to the T12 level. Conus and cauda equina appear normal. Paraspinal and other soft tissues: There is a fluid collection within the soft tissues posterior to the L4-5 level measuring up to 6.8 x 5.4 x 5.6 cm. The collection abuts the left posterolateral aspect of the dura at the L4-5 laminectomy bed extends along the left aspect of the L4 spinous process into the posterior subcutaneous soft tissues. Possible left posterolateral dural defect (series 5, images 9-10; series 6, image 31). Although this could reflect a postoperative seroma or hematoma. Findings are concerning for a contained cerebral spinal fluid leak. Disc levels: T12-L1: Unremarkable. L1-L2: Unremarkable. L2-L3: Unremarkable. L3-L4: Mild diffuse disc bulge with small central protrusion with slight caudal extension. Mild mass effect upon the ventral thecal sac without significant canal stenosis. No foraminal stenosis. Disc protrusion has slightly increased in size compared to the prior MRI. L4-L5: Interval left laminectomy with associated fluid collection, as described above. Left paracentral disc extrusion has increased in size compared to the prior study now resulting in severe canal stenosis with left worse than right subarticular recess stenosis. Bilateral foramina remain patent. L5-S1: Unremarkable. IMPRESSION: 1. Interval left laminectomy with associated fluid collection within the soft tissues posterior to the L4-5 level measuring up to 6.8 x 5.4 x 5.6 cm. Possible left posterolateral dural  defect. Although this could reflect a postoperative seroma or hematoma. Findings are concerning for a contained cerebral spinal fluid leak. 2. Increased size of the left paracentral disc extrusion at L4-5 resulting in severe canal stenosis and left worse than right subarticular recess stenosis. 3. Slight increase in size of a small central disc protrusion at L3-4 without significant canal stenosis. 4. Transitional lumbosacral anatomy was described on the previous MRI report. In keeping with the establish numbering convention, the lowest well developed disc space is designated as L5-S1. These results will be called to the ordering clinician or representative by the Radiologist Assistant, and communication documented in the PACS or [employer]. Electronically Signed   By: Duanne Guess D.O.   On: 12/06/2020 13:35   DG Lumbar Spine 1 View  Result Date: 12/13/2020 CLINICAL DATA:  Lumbar surgery.  Localization. EXAM: LUMBAR SPINE - 1 VIEW COMPARISON:  11/22/2020.  MRI lumbar spine 12/06/2020 FINDINGS: Lowest disc space L5-S1 consistent with prior MRI report. Surgical instrument overlying the spinal canal at the L4-5 level. Tissue spreaders posteriorly at L4-5. IMPRESSION: L4-5 localized. These results were called by telephone at the time of interpretation on 12/13/2020 at 8:54 am to provider [place], who verbally acknowledged these results. Electronically Signed   By: Marlan Palau M.D.   On: 12/13/2020 08:54    Discharge Instructions     Incentive spirometry RT  Complete by: As directed         Follow-up Information     Venita Lick, MD Follow up in 2 week(s).   Specialty: Orthopedic Surgery Why: If symptoms worsen, For suture removal, For wound re-check Contact information: [address] [place] [address] [phone]                 Discharge Plan:  discharge to home  Disposition: stable    Signed: Rhodia Albright for [facility] PA-C [facility] [phone] 12/17/2020, 1:58 PM

## 2020-12-28 DIAGNOSIS — K649 Unspecified hemorrhoids: Secondary | ICD-10-CM | POA: Diagnosis not present

## 2021-01-23 ENCOUNTER — Encounter (HOSPITAL_COMMUNITY): Payer: Self-pay | Admitting: Emergency Medicine

## 2021-01-23 ENCOUNTER — Emergency Department (HOSPITAL_COMMUNITY)
Admission: EM | Admit: 2021-01-23 | Discharge: 2021-01-24 | Disposition: A | Discharge status: Home / Self Care | Payer: BC Managed Care – PPO | Attending: Emergency Medicine | Admitting: Emergency Medicine

## 2021-01-23 ENCOUNTER — Other Ambulatory Visit: Payer: Self-pay

## 2021-01-23 DIAGNOSIS — Z91010 Allergy to peanuts: Secondary | ICD-10-CM | POA: Insufficient documentation

## 2021-01-23 DIAGNOSIS — K6289 Other specified diseases of anus and rectum: Secondary | ICD-10-CM | POA: Diagnosis not present

## 2021-01-23 DIAGNOSIS — K644 Residual hemorrhoidal skin tags: Secondary | ICD-10-CM | POA: Insufficient documentation

## 2021-01-23 NOTE — ED Provider Notes (Signed)
Emergency Medicine Provider Triage Evaluation Note  Ashley Fletcher , a 25 y.o. female  was evaluated in triage.  Pt complains of hemorrhoids that are worsening.  Over the past year since she had a baby she has had hemorrhoids, however now she feels as though they are more painful.  Still able to have bowel movements.  Has noted some bleeding.  Has been to numerous providers to have tried "every medication in the book."  Presenting with her nifedipine currently.  Review of Systems  Positive: Hemorrhoids, pain and bleeding Negative: Constipation or diarrhea  Physical Exam  BP 117/83 (BP Location: Left Arm)   Pulse 86   Temp 98.3 F (36.8 C) (Oral)   Resp 16   Ht 5\' 6"  (1.676 m)   Wt 78.9 kg   SpO2 100%   BMI 28.08 kg/m  Gen:   Awake, no distress   Resp:  Normal effort  MSK:   Moves extremities without difficulty  Other:  Multiple external hemorrhoids noted.  No bleeding.  Medical Decision Making  Medically screening exam initiated at 9:49 PM.  Appropriate orders placed.  Danial M Teachey was informed that the remainder of the evaluation will be completed by another provider, this initial triage assessment does not replace that evaluation, and the importance of remaining in the ED until their evaluation is complete.     Serita Grammes, PA-C 01/23/21 2150    2151, DO 01/25/21 0140

## 2021-01-24 MED ORDER — HYDROCORTISONE ACETATE 25 MG RE SUPP: 25.0000 mg | Freq: Two times a day (BID) | RECTAL | 0 refills | Status: DC

## 2021-01-24 MED ORDER — LIDOCAINE HCL 2 % EX GEL: 1.0000 "application " | CUTANEOUS | 0 refills | Status: DC | PRN

## 2021-01-24 NOTE — ED Notes (Addendum)
Pt left prior to nurse assessment/discharge

## 2021-01-24 NOTE — Discharge Instructions (Signed)
Take the prescribed medication as directed.  Can continue your nifedipine ointment as well. Follow-up with general surgery as soon as you can.  I have attached their information if needed. Return to the ED for new or worsening symptoms.

## 2021-01-24 NOTE — ED Provider Notes (Signed)
[facility]-EMERGENCY DEPT Provider Note   CSN: [id number] Arrival date & time: 01/23/21  2119     History Chief Complaint  Patient presents with   Hemorrhoids    Kenedee M Theissen is a 25 y.o. female.  The history is provided by the patient and medical records.   25 year old female presenting to the ED with rectal pain.  States over the past year she has been having intermittent issues with hemorrhoids, has been worse over the past few weeks.  States she was referred to general surgery for banding, however states she was told it would be more than a week until her referral would go through.  She has tried numerous over-the-counter and prescription medications for hemorrhoids without relief.  She is currently on nifedipine ointment.  She does report some bleeding with bowel movements and this causes her the greatest discomfort but she is not having any difficulty going.  She has not had any abdominal pain or vomiting.  No past medical history on file.  Patient Active Problem List   Diagnosis Date Noted   Recurrent herniation of lumbar disc 12/13/2020   Lumbar disc herniation 11/22/2020   SVD (spontaneous vaginal delivery) 11/19 02/04/2020   Third degree perineal laceration 02/04/2020   Postpartum care following vaginal delivery 11/19 02/04/2020   Normal labor 02/03/2020    Past Surgical History:  Procedure Laterality Date   LUMBAR LAMINECTOMY/DECOMPRESSION MICRODISCECTOMY Left 11/22/2020   Procedure: LUMBAR LAMINECTOMY/DECOMPRESSION MICRODISCECTOMY 1 LEVEL (LEFT L4-5 DISCECTOMY);  Surgeon: Venita Lick, MD;  Location: [facility] OR;  Service: Orthopedics;  Laterality: Left;   LUMBAR LAMINECTOMY/DECOMPRESSION MICRODISCECTOMY N/A 12/13/2020   Procedure: LUMBAR FOUR THROUGH FIVE LEFT REVISION DISCECTOMY WITH REPAIR OF CEREBRAL SPINAL FLUID LEAK;  Surgeon: Venita Lick, MD;  Location: [facility] OR;  Service: Orthopedics;  Laterality: N/A;     OB History     Gravida  1   Para   1   Term  1   Preterm      AB      Living  1      SAB      IAB      Ectopic      Multiple  0   Live Births  1           Family History  Problem Relation Age of Onset   Hypertension Mother    Diabetes Father    Hyperlipidemia Father    Hypertension Father     Social History   Tobacco Use   Smoking status: Never   Smokeless tobacco: Never  Vaping Use   Vaping Use: Never used  Substance Use Topics   Alcohol use: No   Drug use: No    Home Medications Prior to Admission medications   Medication Sig Start Date End Date Taking? Authorizing Provider  hydrocortisone (ANUSOL-HC) 25 MG suppository Place 1 suppository (25 mg total) rectally 2 (two) times daily. 01/24/21  Yes Garlon Hatchet, PA-C  lidocaine (XYLOCAINE) 2 % jelly Apply 1 application topically as needed (rectal pain). 01/24/21  Yes Garlon Hatchet, PA-C  acetaminophen (TYLENOL) 500 MG tablet Take 2 tablets (1,000 mg total) by mouth every 6 (six) hours as needed. Patient taking differently: Take 500 mg by mouth every 6 (six) hours as needed for moderate pain. 02/05/20 02/04/21  Neta Mends, CNM  ondansetron (ZOFRAN) 4 MG tablet Take 1 tablet (4 mg total) by mouth every 8 (eight) hours as needed for nausea or vomiting. 12/13/20  Venita Lick, MD  omeprazole (PRILOSEC) 20 MG capsule Take 1 capsule (20 mg total) by mouth daily. Patient not taking: Reported on 05/31/2019 12/03/17 05/31/19  Cain Saupe, MD    Allergies    Peanut-containing drug products  Review of Systems   Review of Systems  Gastrointestinal:  Positive for rectal pain.  All other systems reviewed and are negative.  Physical Exam Updated Vital Signs BP 134/69 (BP Location: Left Arm)   Pulse 84   Temp 98.7 F (37.1 C) (Oral)   Resp 17   Ht 5\' 6"  (1.676 m)   Wt 78.9 kg   LMP 12/22/2020 (Approximate)   SpO2 98%   Breastfeeding Yes   BMI 28.08 kg/m   Physical Exam Vitals and nursing note reviewed.  Constitutional:       Appearance: She is well-developed.  HENT:     Head: Normocephalic and atraumatic.  Eyes:     Conjunctiva/sclera: Conjunctivae normal.     Pupils: Pupils are equal, round, and reactive to light.  Cardiovascular:     Rate and Rhythm: Normal rate and regular rhythm.     Heart sounds: Normal heart sounds.  Pulmonary:     Effort: Pulmonary effort is normal.     Breath sounds: Normal breath sounds.  Abdominal:     General: Bowel sounds are normal.     Palpations: Abdomen is soft.  Genitourinary:    Comments: Multiple external hemorrhoids noted, no active bleeding, no signs of thrombosis Musculoskeletal:        General: Normal range of motion.     Cervical back: Normal range of motion.  Skin:    General: Skin is warm and dry.  Neurological:     Mental Status: She is alert and oriented to person, place, and time.    ED Results / Procedures / Treatments   Labs (all labs ordered are listed, but only abnormal results are displayed) Labs Reviewed - No data to display  EKG None  Radiology No results found.  Procedures Procedures   Medications Ordered in ED Medications - No data to display  ED Course  I have reviewed the triage vital signs and the nursing notes.  Pertinent labs & imaging results that were available during my care of the patient were reviewed by me and considered in my medical decision making (see chart for details).    MDM Rules/Calculators/A&P                           For 22-year-old female who with rectal pain secondary to hemorrhoids.  Ongoing over the past year.  Currently awaiting appointment with general surgery for banding.  Has attempted multiple medications without relief.  On exam, does have multiple external nonthrombosed, nonbleeding hemorrhoids.  She does not have any abdominal pain.  She is having regular bowel movements.  Discussed with her limited intervention of this from the ED.  I do feel she likely will need banding.  We will need to  continue forward with her general surgery consultation.  We will give Anusol suppositories along with lidocaine jelly.  She can return here for new/acute changes.  Final Clinical Impression(s) / ED Diagnoses Final diagnoses:  External hemorrhoids    Rx / DC Orders ED Discharge Orders          Ordered    lidocaine (XYLOCAINE) 2 % jelly  As needed        01/24/21 0009    hydrocortisone (ANUSOL-HC)  25 MG suppository  2 times daily        01/24/21 0009             Garlon Hatchet, PA-C 01/24/21 0021    Nira Conn, MD 01/30/21 Barry Brunner

## 2021-02-07 ENCOUNTER — Emergency Department (HOSPITAL_COMMUNITY)
Admission: EM | Admit: 2021-02-07 | Discharge: 2021-02-07 | Disposition: A | Discharge status: Home / Self Care | Payer: BC Managed Care – PPO | Attending: Emergency Medicine | Admitting: Emergency Medicine

## 2021-02-07 ENCOUNTER — Encounter (HOSPITAL_COMMUNITY): Payer: Self-pay

## 2021-02-07 ENCOUNTER — Emergency Department (HOSPITAL_COMMUNITY): Payer: BC Managed Care – PPO

## 2021-02-07 DIAGNOSIS — Z91010 Allergy to peanuts: Secondary | ICD-10-CM | POA: Insufficient documentation

## 2021-02-07 DIAGNOSIS — M5417 Radiculopathy, lumbosacral region: Secondary | ICD-10-CM | POA: Diagnosis not present

## 2021-02-07 DIAGNOSIS — R200 Anesthesia of skin: Secondary | ICD-10-CM | POA: Diagnosis not present

## 2021-02-07 DIAGNOSIS — N9489 Other specified conditions associated with female genital organs and menstrual cycle: Secondary | ICD-10-CM | POA: Diagnosis not present

## 2021-02-07 DIAGNOSIS — M79605 Pain in left leg: Secondary | ICD-10-CM | POA: Diagnosis not present

## 2021-02-07 DIAGNOSIS — M545 Low back pain, unspecified: Secondary | ICD-10-CM | POA: Diagnosis not present

## 2021-02-07 LAB — I-STAT BETA HCG BLOOD, ED (MC, WL, AP ONLY): I-stat hCG, quantitative: <5 m[IU]/mL (ref <5)

## 2021-02-07 MED ORDER — GADOBUTROL 1 MMOL/ML IV SOLN
8.5000 mL | Freq: Once | INTRAVENOUS | Status: AC | PRN
  Administered 2021-02-07: 8.5 mL via INTRAVENOUS

## 2021-02-07 MED ORDER — DEXAMETHASONE SODIUM PHOSPHATE 10 MG/ML IJ SOLN
10.0000 mg | Freq: Once | INTRAMUSCULAR | Status: AC
  Administered 2021-02-07: 10 mg via INTRAVENOUS
  Filled 2021-02-07: qty 1

## 2021-02-07 MED ORDER — KETOROLAC TROMETHAMINE 15 MG/ML IJ SOLN
15.0000 mg | Freq: Once | INTRAMUSCULAR | Status: AC
  Administered 2021-02-07: 15 mg via INTRAVENOUS
  Filled 2021-02-07: qty 1

## 2021-02-07 NOTE — ED Notes (Signed)
Pt. Gone to MRI 

## 2021-02-07 NOTE — Discharge Instructions (Signed)
BE SURE TO PUMP AND DUMP YOUR MILK PRIOR TO NEXT BREASTFEEDING  SEEK IMMEDIATE MEDICAL ATTENTION IF: New numbness, tingling, weakness, or problem with the use of your arms or legs.  Severe back pain not relieved with medications.  Change in bowel or bladder control (if you lose control of stool or urine, or if you are unable to urinate) Increasing pain in any areas of the body (such as chest or abdominal pain).  Shortness of breath, dizziness or fainting.  Nausea (feeling sick to your stomach), vomiting, fever, or sweats.

## 2021-02-07 NOTE — ED Provider Notes (Signed)
[facility]-EMERGENCY DEPT Provider Note   CSN: [id number] Arrival date & time: 02/07/21  1918     History Chief Complaint  Patient presents with   Leg Pain    Ashley Fletcher is a 25 y.o. female.  The history is provided by the patient.  Leg Pain Location:  Leg Time since incident:  1 week Injury: no   Leg location:  L leg Pain details:    Quality:  Aching and burning   Severity:  Moderate   Onset quality:  Gradual   Timing:  Intermittent   Progression:  Waxing and waning Chronicity:  Recurrent Relieved by:  Nothing Worsened by:  Nothing Associated symptoms: back pain and numbness   Associated symptoms: no fever and no muscle weakness       Patient reports a previous history of lumbar microdiscectomy that required cerebrospinal fluid leak repair last September.  She reports she has recently been doing well but for the past week began having some low back pain, left leg pain and numbness in her leg intermittently.  No recent falls or trauma.  No fevers or vomiting.  No incontinence.  She is able to ambulate.  She called her surgeon who recommended ER evaluation  Patient Active Problem List   Diagnosis Date Noted   Recurrent herniation of lumbar disc 12/13/2020   Lumbar disc herniation 11/22/2020   SVD (spontaneous vaginal delivery) 11/19 02/04/2020   Third degree perineal laceration 02/04/2020   Postpartum care following vaginal delivery 11/19 02/04/2020   Normal labor 02/03/2020    Past Surgical History:  Procedure Laterality Date   LUMBAR LAMINECTOMY/DECOMPRESSION MICRODISCECTOMY Left 11/22/2020   Procedure: LUMBAR LAMINECTOMY/DECOMPRESSION MICRODISCECTOMY 1 LEVEL (LEFT L4-5 DISCECTOMY);  Surgeon: Venita Lick, MD;  Location: [facility] OR;  Service: Orthopedics;  Laterality: Left;   LUMBAR LAMINECTOMY/DECOMPRESSION MICRODISCECTOMY N/A 12/13/2020   Procedure: LUMBAR FOUR THROUGH FIVE LEFT REVISION DISCECTOMY WITH REPAIR OF CEREBRAL SPINAL FLUID LEAK;   Surgeon: Venita Lick, MD;  Location: [facility] OR;  Service: Orthopedics;  Laterality: N/A;     OB History     Gravida  1   Para  1   Term  1   Preterm      AB      Living  1      SAB      IAB      Ectopic      Multiple  0   Live Births  1           Family History  Problem Relation Age of Onset   Hypertension Mother    Diabetes Father    Hyperlipidemia Father    Hypertension Father     Social History   Tobacco Use   Smoking status: Never   Smokeless tobacco: Never  Vaping Use   Vaping Use: Never used  Substance Use Topics   Alcohol use: No   Drug use: No    Home Medications Prior to Admission medications   Medication Sig Start Date End Date Taking? Authorizing Provider  hydrocortisone (ANUSOL-HC) 25 MG suppository Place 1 suppository (25 mg total) rectally 2 (two) times daily. 01/24/21   Garlon Hatchet, PA-C  lidocaine (XYLOCAINE) 2 % jelly Apply 1 application topically as needed (rectal pain). 01/24/21   Garlon Hatchet, PA-C  ondansetron (ZOFRAN) 4 MG tablet Take 1 tablet (4 mg total) by mouth every 8 (eight) hours as needed for nausea or vomiting. 12/13/20   Venita Lick, MD  omeprazole (PRILOSEC) 20  MG capsule Take 1 capsule (20 mg total) by mouth daily. Patient not taking: Reported on 05/31/2019 12/03/17 05/31/19  Cain Saupe, MD    Allergies    Peanut-containing drug products  Review of Systems   Review of Systems  Constitutional:  Negative for fever.  Respiratory:  Negative for shortness of breath.   Cardiovascular:  Negative for chest pain.  Genitourinary:  Negative for dysuria.  Musculoskeletal:  Positive for back pain.  Neurological:  Positive for numbness. Negative for weakness.  All other systems reviewed and are negative.  Physical Exam Updated Vital Signs BP 100/86 (BP Location: Right Arm)   Pulse 80   Temp 98.8 F (37.1 C) (Oral)   Resp 16   Ht 1.676 m (5\' 6" )   Wt 79.4 kg   SpO2 99%   BMI 28.25 kg/m   Physical  Exam CONSTITUTIONAL: Well developed/well nourished, patient laughing and speaking to family in no distress HEAD: Normocephalic/atraumatic EYES: EOMI/PERRL ENMT: Mucous membranes moist NECK: supple no meningeal signs SPINE/BACK:entire spine nontender well-healed incision noted in the lumbar spine.  No fluctuance or erythema.  No tenderness No bruising/crepitance/stepoffs noted to spine CV: S1/S2 noted, no murmurs/rubs/gallops noted LUNGS: Lungs are clear to auscultation bilaterally, no apparent distress ABDOMEN: soft, nontender, no rebound or guarding GU:no cva tenderness NEURO: Awake/alert, equal motor 5/5 strength noted with the following: hip flexion/knee flexion/extension, foot dorsi/plantar flexion, great toe extension intact bilaterally, no clonus bilaterally, plantar reflex appropriate (toes downgoing), no sensory deficit in any dermatome.  Equal patellar/achilles reflex noted  in bilateral lower extremities.  Pt is able to ambulate unassisted. EXTREMITIES: pulses normal, full ROM SKIN: warm, color normal PSYCH: no abnormalities of mood noted, alert and oriented to situation  ED Results / Procedures / Treatments   Labs (all labs ordered are listed, but only abnormal results are displayed) Labs Reviewed  I-STAT BETA HCG BLOOD, ED ([facility], [facility], [facility] ONLY)    EKG None  Radiology MR Lumbar Spine W Wo Contrast  Result Date: 02/07/2021 CLINICAL DATA:  Low back pain, prior surgery, lower leg numbness EXAM: MRI LUMBAR SPINE WITHOUT AND WITH CONTRAST TECHNIQUE: Multiplanar and multiecho pulse sequences of the lumbar spine were obtained without and with intravenous contrast. CONTRAST:  8.70mL GADAVIST GADOBUTROL 1 MMOL/ML IV SOLN COMPARISON:  12/06/2020 FINDINGS: Segmentation: Transitional anatomy at L5, with right assimilation joint. The lowest well-formed disc space is labeled L5-S1, in keeping with prior reports. Alignment:  Physiologic. Vertebrae: No acute fracture, suspicious osseous lesion,  abnormal enhancement, or evidence of discitis. Status post left L4-L5 hemilaminectomy, with spacer between the spinous processes of L4 and L5. Conus medullaris and cauda equina: Conus extends to the T12 level. Conus and cauda equina appear normal. No abnormal enhancement. Paraspinal and other soft tissues: Small fluid collection in the left L4-L5 laminectomy defect (series 9, image 12 and series 6, image 10), measuring to 2.6 x 1.0 x 2.1 cm. This collection does not appear to connect with the thecal sac, but does extend to the posterior aspect of the L4-L5 disc. The collection does not appear to enhance. Previously noted larger fluid collection in the soft tissues posterior to L4-L5 has resolved. There is increased T2 signal and enhancement in this area, consistent with postsurgical changes and edema but no focal collection. Disc levels: T12-L1: No significant disc bulge. No spinal canal stenosis or neural foraminal narrowing. L1-L2: No significant disc bulge. No spinal canal stenosis or neural foraminal narrowing. L2-L3: No significant disc bulge. No spinal canal stenosis or  neural foraminal narrowing. L3-L4: Mild disc bulge with small central protrusion and central and slightly caudal annular fissure. Mild facet arthropathy. No spinal canal stenosis or neural foraminal narrowing. L4-L5: Status post left L4-L5 hemilaminectomy, with interval repeat microdiscectomy and repair of prior CSF leak. Aforementioned small fluid collection along the left aspect of the L5, which extends through the left facets to contact the disc (series 9, image 11 and series 6, image 10), which together are likely compressing the descending left L5 nerve. Previously noted left paracentral disc extrusion is decreased in size compared to the prior exam, now small and not causing significant spinal canal stenosis. Small amount of granulation tissue along the left and posterior aspect of the thecal sac does not cause significant thecal sac  narrowing. No neural foraminal narrowing. L5-S1: No significant disc bulge. No spinal canal stenosis or neural foraminal narrowing. IMPRESSION: 1. Status post left L4-L5 hemilaminectomy, with interval repeat microdiscectomy and repair of a prior CSF leak. Small residual fluid collection along the left aspect of the L5 spinous process, extending through the laminectomy defect, and contacting the L4-L5 intervertebral disc. Although the previously noted L4-L5 disc bulge is smaller and no longer causes significant spinal canal compression, the disc and fluid collection combine to likely compress the descending left L4 nerve. This fluid collection does not demonstrate significant enhancement and is favored to be postsurgical rather than an abscess. 2. Previously noted larger fluid collection in the soft tissues posterior to L4-L5 has resolved, with residual T2 hyperintense signal and enhancement, likely edema, but no focal collection. Electronically Signed   By: Wiliam Ke M.D.   On: 02/07/2021 21:45    Procedures Procedures   Medications Ordered in ED Medications  dexamethasone (DECADRON) injection 10 mg (10 mg Intravenous Given 02/07/21 2205)  ketorolac (TORADOL) 15 MG/ML injection 15 mg (15 mg Intravenous Given 02/07/21 2203)  gadobutrol (GADAVIST) 1 MMOL/ML injection 8.5 mL (8.5 mLs Intravenous Contrast Given 02/07/21 2117)    ED Course  I have reviewed the triage vital signs and the nursing notes.  Pertinent labs & imaging results that were available during my care of the patient were reviewed by me and considered in my medical decision making (see chart for details).    MDM Rules/Calculators/A&P                           Patient is very well-appearing.  She has no neurodeficits and currently has no sensory deficit. She can ambulate without difficulty.  We discussed MRI findings.  Does not appear that this represents any sort of abscess collection.  There is compression in the L4/L5 range  which could be contributing to her intermittent symptoms. Patient declines any home pain medications.  She will call her spine specialist next week.  We discussed strict  return precautions Final Clinical Impression(s) / ED Diagnoses Final diagnoses:  Lumbosacral radiculopathy at L4    Rx / DC Orders ED Discharge Orders     None        Zadie Rhine, MD 02/08/21 0002

## 2021-02-07 NOTE — ED Provider Notes (Signed)
Emergency Medicine Provider Triage Evaluation Note  Ashley Fletcher , a 25 y.o. female  was evaluated in triage.  Pt complains of paresthesias and numbness to the LLE. Has had 2 microdiscectomies completed in September. Had been doing well until today when she started to have these sxs.  Review of Systems  Positive: Paresthesias, numbness Negative: incontinence  Physical Exam  BP 133/84 (BP Location: Left Arm)   Pulse 86   Temp 99 F (37.2 C) (Oral)   Resp 16   Ht 5\' 6"  (1.676 m)   Wt 79.4 kg   SpO2 95%   BMI 28.25 kg/m  Gen:   Awake, no distress   Resp:  Normal effort  MSK:   Moves extremities without difficulty  Other:  Dp pulse intact, no calf ttp or unilateral swelling, decreased sensation to the lle  Medical Decision Making  Medically screening exam initiated at 7:32 PM.  Appropriate orders placed.  Amarya M Guyton was informed that the remainder of the evaluation will be completed by another provider, this initial triage assessment does not replace that evaluation, and the importance of remaining in the ED until their evaluation is complete.     Serita Grammes, PA-C 02/07/21 1932    02/09/21, MD 02/07/21 1941

## 2021-02-07 NOTE — ED Notes (Signed)
MRI called 

## 2021-02-07 NOTE — ED Triage Notes (Addendum)
Patient arrives from home with report of left leg pain that began x 1 week ago, reports pain 5/10. Pt also reports numbness that has been ongoing all day. Pt had spinal surgery 09/20.

## 2021-02-18 DIAGNOSIS — M5451 Vertebrogenic low back pain: Secondary | ICD-10-CM | POA: Diagnosis not present

## 2021-02-28 DIAGNOSIS — K648 Other hemorrhoids: Secondary | ICD-10-CM | POA: Diagnosis not present

## 2021-03-07 DIAGNOSIS — M5451 Vertebrogenic low back pain: Secondary | ICD-10-CM | POA: Diagnosis not present

## 2021-03-11 ENCOUNTER — Encounter (HOSPITAL_BASED_OUTPATIENT_CLINIC_OR_DEPARTMENT_OTHER): Payer: Self-pay | Admitting: *Deleted

## 2021-03-11 ENCOUNTER — Emergency Department (HOSPITAL_BASED_OUTPATIENT_CLINIC_OR_DEPARTMENT_OTHER)
Admission: EM | Admit: 2021-03-11 | Discharge: 2021-03-11 | Disposition: A | Discharge status: Home / Self Care | Payer: BC Managed Care – PPO | Attending: Emergency Medicine | Admitting: Emergency Medicine

## 2021-03-11 ENCOUNTER — Other Ambulatory Visit: Payer: Self-pay

## 2021-03-11 DIAGNOSIS — M545 Low back pain, unspecified: Secondary | ICD-10-CM | POA: Diagnosis not present

## 2021-03-11 DIAGNOSIS — Z5321 Procedure and treatment not carried out due to patient leaving prior to being seen by health care provider: Secondary | ICD-10-CM | POA: Diagnosis not present

## 2021-03-11 DIAGNOSIS — I959 Hypotension, unspecified: Secondary | ICD-10-CM | POA: Diagnosis not present

## 2021-03-11 DIAGNOSIS — M549 Dorsalgia, unspecified: Secondary | ICD-10-CM | POA: Diagnosis not present

## 2021-03-11 NOTE — ED Notes (Signed)
Pt called for with no response, notified by registration that pt left

## 2021-03-11 NOTE — ED Notes (Signed)
Pt states she is leaving due to long wait. 

## 2021-03-11 NOTE — ED Triage Notes (Signed)
To ER via EMT. Back surgery in September. States she has had physical therapy x 2 recently. Pain in her lower back.

## 2021-03-12 DIAGNOSIS — M545 Low back pain, unspecified: Secondary | ICD-10-CM | POA: Diagnosis not present

## 2021-03-25 ENCOUNTER — Ambulatory Visit: Payer: Self-pay | Admitting: Surgery

## 2021-03-25 DIAGNOSIS — K6289 Other specified diseases of anus and rectum: Secondary | ICD-10-CM | POA: Diagnosis not present

## 2021-03-25 DIAGNOSIS — K648 Other hemorrhoids: Secondary | ICD-10-CM | POA: Diagnosis not present

## 2021-05-01 DIAGNOSIS — M25532 Pain in left wrist: Secondary | ICD-10-CM | POA: Diagnosis not present

## 2021-05-13 DIAGNOSIS — Z4889 Encounter for other specified surgical aftercare: Secondary | ICD-10-CM | POA: Diagnosis not present

## 2021-06-17 DIAGNOSIS — Z4889 Encounter for other specified surgical aftercare: Secondary | ICD-10-CM | POA: Diagnosis not present

## 2021-06-19 DIAGNOSIS — Z8349 Family history of other endocrine, nutritional and metabolic diseases: Secondary | ICD-10-CM | POA: Diagnosis not present

## 2021-06-19 DIAGNOSIS — R5383 Other fatigue: Secondary | ICD-10-CM | POA: Diagnosis not present

## 2021-07-07 ENCOUNTER — Emergency Department (HOSPITAL_COMMUNITY)
Admission: EM | Admit: 2021-07-07 | Discharge: 2021-07-07 | Disposition: A | Discharge status: Home / Self Care | Payer: BC Managed Care – PPO | Attending: Emergency Medicine | Admitting: Emergency Medicine

## 2021-07-07 ENCOUNTER — Encounter (HOSPITAL_COMMUNITY): Payer: Self-pay

## 2021-07-07 ENCOUNTER — Other Ambulatory Visit: Payer: Self-pay

## 2021-07-07 DIAGNOSIS — Z91010 Allergy to peanuts: Secondary | ICD-10-CM | POA: Insufficient documentation

## 2021-07-07 DIAGNOSIS — K645 Perianal venous thrombosis: Secondary | ICD-10-CM | POA: Diagnosis not present

## 2021-07-07 DIAGNOSIS — K6289 Other specified diseases of anus and rectum: Secondary | ICD-10-CM | POA: Diagnosis not present

## 2021-07-07 DIAGNOSIS — K644 Residual hemorrhoidal skin tags: Secondary | ICD-10-CM | POA: Diagnosis not present

## 2021-07-07 MED ORDER — LIDOCAINE HCL URETHRAL/MUCOSAL 2 % EX GEL
1.0000 "application " | Freq: Once | CUTANEOUS | Status: AC
  Administered 2021-07-07: 1 via TOPICAL
  Filled 2021-07-07: qty 11

## 2021-07-07 NOTE — Discharge Instructions (Signed)
Please call and follow up with [name] tomorrow to be seen next week for your care.   ? ?Warm water baths or ice packs (30-60 minutes up to 8 times a day, especially after bowel movements) will help. Use ice for the first few days to help decrease swelling and bruising, then switch to heat such as warm towels, sitz baths, warm baths, warm showers, etc to help relax tight/sore spots and speed recovery. Some people prefer to use ice alone, heat alone, alternating between ice & heat. Experiment to what works for you.  ? ?It is helpful to take an over-the-counter pain medication continuously for the first few weeks. Choose one of the following that works best for you: ?Naproxen (Aleve, etc) Two 220mg  tabs twice a day ?Ibuprofen (Advil, etc) Three 200mg  tabs four times a day (every meal & bedtime) ?Acetaminophen (Tylenol, etc) 500-650mg  four times a day (every meal & bedtime) ? ?KEEP YOUR BOWELS REGULAR ?The goal is one soft bowel movement a day ?Avoid getting constipated. Between the surgery and the pain medications, it is common to experience some constipation. Increasing fluid intake and taking a fiber supplement (such as Metamucil, Citrucel, FiberCon, MiraLax, etc) 2-4 times a day regularly will usually help prevent this problem from occurring. A mild laxative (prune juice, Milk of Magnesia, MiraLax, etc) should be taken according to package directions if there are no bowel movements after 48 hours. ?Watch out for diarrhea. If you have many loose bowel movements, simplify your diet to bland foods & liquids for a few days. Stop any stool softeners and decrease your fiber supplement. Switching to mild anti-diarrheal medications (Kayopectate, Pepto Bismol) can help. Can try an imodium/loperamide dose. If this worsens or does not improve, please call . ?

## 2021-07-07 NOTE — ED Provider Notes (Signed)
?[facility]-EMERGENCY DEPT ?Provider Note ? ? ?CSN: [id number] ?Arrival date & time: 07/07/21  8299 ? ?  ? ?History ? ?Chief Complaint  ?Patient presents with  ? Hemorrhoids  ? ? ?Ashley Fletcher is a 26 y.o. female. ? ?The history is provided by the patient and medical records. No language interpreter was used.  ? ?26 year old female with known history of hemorrhoids presenting complaining of rectal pain.  Patient states she has been dealing with painful hemorrhoid for quite a while but for the past 3 weeks she is having progressive worsening rectal pain because of the hemorrhoid.  Last night she could hardly sleep due to the severe pain.  She described it as a sharp stabbing stinging sensation, persistent lasting for hours.  She denies having any fever abdominal pain dysuria constipation or rectal bleeding.  She has tried every avenue to alleviate her symptoms which includes using lidocaine, suppository, stool softener, sitz bath, steroid cream, all of which initially did help but now it provided no relief.  She is scheduled to have hemorrhoidectomy in June but felt that she could not wait that long.  She denies any injury. ? ?Home Medications ?Prior to Admission medications   ?Medication Sig Start Date End Date Taking? Authorizing Provider  ?hydrocortisone (ANUSOL-HC) 25 MG suppository Place 1 suppository (25 mg total) rectally 2 (two) times daily. 01/24/21   Garlon Hatchet, PA-C  ?lidocaine (XYLOCAINE) 2 % jelly Apply 1 application topically as needed (rectal pain). 01/24/21   Garlon Hatchet, PA-C  ?ondansetron (ZOFRAN) 4 MG tablet Take 1 tablet (4 mg total) by mouth every 8 (eight) hours as needed for nausea or vomiting. 12/13/20   Venita Lick, MD  ?omeprazole (PRILOSEC) 20 MG capsule Take 1 capsule (20 mg total) by mouth daily. ?Patient not taking: Reported on 05/31/2019 12/03/17 05/31/19  Cain Saupe, MD  ?   ? ?Allergies    ?Peanut-containing drug products   ? ?Review of Systems   ?Review  of Systems  ?All other systems reviewed and are negative. ? ?Physical Exam ?Updated Vital Signs ?BP 115/83 (BP Location: Right Arm)   Pulse 80   Temp 97.9 ?F (36.6 ?C) (Oral)   Resp 16   Ht 5\' 6"  (1.676 m)   Wt 78.5 kg   SpO2 100%   BMI 27.92 kg/m?  ?Physical Exam ?Vitals and nursing note reviewed.  ?Constitutional:   ?   General: She is not in acute distress. ?   Appearance: She is well-developed.  ?HENT:  ?   Head: Atraumatic.  ?Eyes:  ?   Conjunctiva/sclera: Conjunctivae normal.  ?Pulmonary:  ?   Effort: Pulmonary effort is normal.  ?Abdominal:  ?   Palpations: Abdomen is soft.  ?   Tenderness: There is no abdominal tenderness.  ?Genitourinary: ?   Comments: Chaperone present during exam.  Numerous external hemorrhoid and possible thrombosed hemorrhoid noted on initial visualization.  Patient cannot tolerate digital rectal exam.  No obvious blood noted. ?Musculoskeletal:  ?   Cervical back: Neck supple.  ?Skin: ?   Findings: No rash.  ?Neurological:  ?   Mental Status: She is alert.  ?Psychiatric:     ?   Mood and Affect: Mood normal.  ? ? ?ED Results / Procedures / Treatments   ?Labs ?(all labs ordered are listed, but only abnormal results are displayed) ?Labs Reviewed - No data to display ? ?EKG ?None ? ?Radiology ?No results found. ? ?Procedures ?Procedures  ? ? ?Medications Ordered  in ED ?Medications - No data to display ? ?ED Course/ Medical Decision Making/ A&P ?  ?                        ?Medical Decision Making ? ?BP 115/73   Pulse 81   Temp 97.9 ?F (36.6 ?C) (Oral)   Resp 16   Ht 5\' 6"  (1.676 m)   Wt 78.5 kg   SpO2 100%   BMI 27.92 kg/m?  ? ?7:45 AM ?This is a 26 year old female presenting complaining of rectal pain secondary to her known hemorrhoids.  She is scheduled to have hemorrhoidectomy in June however within the past few weeks her pain has become unbearable despite exhausting all outpatient management which includes sitz bath, topical cream, pain medication, stool softener, and  lidocaine.  I have independently review her previous notes from surgery and considered in my care plan.  On exam she does have numerous external hemorrhoid with some thrombosed hemorrhoid very tender to palpation but no obvious rectal bleeding.  She does not have any other concerning findings no abdominal pain no fever no signs of infection.  No evidence to suggest perirectal abscess, or anal fissure.  I have consulted on-call general surgeon Dr. July, who felt that patient certainly can follow-up closely in office in the next few days.  I did give patient lido gel to apply rectally for comfort and she did report improvement.  I discussed appropriate home care to help alleviate her symptoms.  She understands to follow-up outpatient for more definitive management.  Return precaution given. ? ? ? ? ? ? ? ? ? ? ? ? ?Final Clinical Impression(s) / ED Diagnoses ?Final diagnoses:  ?Thrombosed external hemorrhoid  ? ? ?Rx / DC Orders ?ED Discharge Orders   ? ? None  ? ?  ? ? ?  ?Carolynne Edouard, PA-C ?07/07/21 [phone] ? ?  ?[id number], MD ?07/10/21 1547 ? ?

## 2021-07-07 NOTE — ED Triage Notes (Signed)
Pt reports with hemorrhoids and states that they have been hurting since midnight. Pt reports having prolapsed hemorrhoids and states that they hurt when they go back in. Pt reports that she is to have surgery for them to be removed.  ?

## 2021-08-07 ENCOUNTER — Ambulatory Visit: Payer: Self-pay | Admitting: Surgery

## 2021-08-14 NOTE — Progress Notes (Signed)
DUE TO COVID-19 ONLY ONE VISITOR IS ALLOWED TO COME WITH YOU AND STAY IN THE WAITING ROOM ONLY DURING PRE OP AND PROCEDURE DAY OF SURGERY.  2 VISITOR  Eiley VISIT WITH YOU AFTER SURGERY IN YOUR PRIVATE ROOM DURING VISITING HOURS ONLY! YOU Margaretha HAVE ONE PERSON SPEND THE NITE WITH YOU IN YOUR ROOM AFTER SURGERY.    YOU NEED TO HAVE A COVID 19 TEST ON                                 COME THRU MAIN ENTRANCE AT [facility] HAVE A SEAT IN THE LOBBY ON THE RIGHT AS YOU COME THRU THE DOOR.  CALL [phone] AND GIVE THEM YOUR NAME AND LET THEM KNOW YOU ARE HERE FOR COVID TESTING.    Your procedure is scheduled on:    Report to [facility] Main  Entrance   Report to admitting at                AM DO NOT BRING INSURANCE CARD, PICTURE ID OR WALLET DAY OF SURGERY.      Call this number if you have problems the morning of surgery [phone]  Day prior to surgery- liquids or pureed foods.  Drink plenty of liquids.  AT 1000am day prior to surgery- 4 Tablespoons ( 2 ounces) Milk of Magnesia  And again at 200pm.     REMEMBER: NO  SOLID FOODS , CANDY, GUM OR MINTS AFTER MIDNITE THE NITE BEFORE SURGERY .       Marland Kitchen CLEAR LIQUIDS UNTIL     0430am            DAY OF SURGERY.      PLEASE FINISH ENSURE DRINK PER SURGEON ORDER  WHICH NEEDS TO BE COMPLETED AT    0430am       MORNING OF SURGERY.       CLEAR LIQUID DIET   Foods Allowed      WATER BLACK COFFEE ( SUGAR OK, NO MILK, CREAM OR CREAMER) REGULAR AND DECAF  TEA ( SUGAR OK NO MILK, CREAM, OR CREAMER) REGULAR AND DECAF  PLAIN JELLO ( NO RED)  FRUIT ICES ( NO RED, NO FRUIT PULP)  POPSICLES ( NO RED)  JUICE- APPLE, WHITE GRAPE AND WHITE CRANBERRY  SPORT DRINK LIKE GATORADE ( NO RED)  CLEAR BROTH ( VEGETABLE , CHICKEN OR BEEF)                                                                     BRUSH YOUR TEETH MORNING OF SURGERY AND RINSE YOUR MOUTH OUT, NO CHEWING GUM CANDY OR MINTS.     Take these medicines the morning of surgery with A SIP  OF WATER:  none    DO NOT TAKE ANY DIABETIC MEDICATIONS DAY OF YOUR SURGERY                               You Kaileah not have any metal on your body including hair pins and              piercings  Do not wear jewelry, make-up,  lotions, powders or perfumes, deodorant             Do not wear nail polish on your fingernails.              IF YOU ARE A FEMALE AND WANT TO SHAVE UNDER ARMS OR LEGS PRIOR TO SURGERY YOU MUST DO SO AT LEAST 48 HOURS PRIOR TO SURGERY.              Men Cristle shave face and neck.   Do not bring valuables to the hospital. [facility] IS NOT             RESPONSIBLE   FOR VALUABLES.  Contacts, dentures or bridgework Deema not be worn into surgery.  Leave suitcase in the car. After surgery it Jasiel be brought to your room.     Patients discharged the day of surgery will not be allowed to drive home. IF YOU ARE HAVING SURGERY AND GOING HOME THE SAME DAY, YOU MUST HAVE AN ADULT TO DRIVE YOU HOME AND BE WITH YOU FOR 24 HOURS. YOU Delaynie GO HOME BY TAXI OR UBER OR ORTHERWISE, BUT AN ADULT MUST ACCOMPANY YOU HOME AND STAY WITH YOU FOR 24 HOURS.                Please read over the following fact sheets you were given: _____________________________________________________________________  [facility] - Preparing for Surgery Before surgery, you can play an important role.  Because skin is not sterile, your skin needs to be as free of germs as possible.  You can reduce the number of germs on your skin by washing with CHG (chlorahexidine gluconate) soap before surgery.  CHG is an antiseptic cleaner which kills germs and bonds with the skin to continue killing germs even after washing. Please DO NOT use if you have an allergy to CHG or antibacterial soaps.  If your skin becomes reddened/irritated stop using the CHG and inform your nurse when you arrive at Short Stay. Do not shave (including legs and underarms) for at least 48 hours prior to the first CHG shower.  You Ayssa shave your  face/neck. Please follow these instructions carefully:  1.  Shower with CHG Soap the night before surgery and the  morning of Surgery.  2.  If you choose to wash your hair, wash your hair first as usual with your  normal  shampoo.  3.  After you shampoo, rinse your hair and body thoroughly to remove the  shampoo.                           4.  Use CHG as you would any other liquid soap.  You can apply chg directly  to the skin and wash                       Gently with a scrungie or clean washcloth.  5.  Apply the CHG Soap to your body ONLY FROM THE NECK DOWN.   Do not use on face/ open                           Wound or open sores. Avoid contact with eyes, ears mouth and genitals (private parts).                       Wash face,  Genitals (private parts) with your normal soap.  6.  Wash thoroughly, paying special attention to the area where your surgery  will be performed.  7.  Thoroughly rinse your body with warm water from the neck down.  8.  DO NOT shower/wash with your normal soap after using and rinsing off  the CHG Soap.                9.  Pat yourself dry with a clean towel.            10.  Wear clean pajamas.            11.  Place clean sheets on your bed the night of your first shower and do not  sleep with pets. Day of Surgery : Do not apply any lotions/deodorants the morning of surgery.  Please wear clean clothes to the hospital/surgery center.  FAILURE TO FOLLOW THESE INSTRUCTIONS Bo RESULT IN THE CANCELLATION OF YOUR SURGERY PATIENT SIGNATURE_________________________________  NURSE SIGNATURE__________________________________  ________________________________________________________________________

## 2021-08-14 NOTE — Progress Notes (Signed)
Anesthesia Review:  PCP: Cardiologist : Chest x-ray : EKG : Echo : Stress test: Cardiac Cath :  Activity level:  Sleep Study/ CPAP : Fasting Blood Sugar :      / Checks Blood Sugar -- times a day:   Blood Thinner/ Instructions /Last Dose: ASA / Instructions/ Last Dose :  

## 2021-08-15 ENCOUNTER — Encounter (HOSPITAL_COMMUNITY)
Admission: RE | Admit: 2021-08-15 | Discharge: 2021-08-15 | Disposition: A | Discharge status: Home / Self Care | Payer: BC Managed Care – PPO | Source: Ambulatory Visit | Attending: Anesthesiology | Admitting: Anesthesiology

## 2021-08-15 DIAGNOSIS — Z01818 Encounter for other preprocedural examination: Secondary | ICD-10-CM

## 2021-08-23 ENCOUNTER — Ambulatory Visit (HOSPITAL_COMMUNITY): Admission: RE | Admit: 2021-08-23 | Payer: BC Managed Care – PPO | Source: Home / Self Care | Admitting: Surgery

## 2021-08-23 ENCOUNTER — Encounter (HOSPITAL_COMMUNITY): Admission: RE | Payer: Self-pay | Source: Home / Self Care

## 2021-08-23 SURGERY — SPHINCTEROTOMY, ANAL
Anesthesia: General

## 2021-09-10 DIAGNOSIS — M545 Low back pain, unspecified: Secondary | ICD-10-CM | POA: Diagnosis not present

## 2021-10-15 ENCOUNTER — Emergency Department (HOSPITAL_COMMUNITY)
Admission: EM | Admit: 2021-10-15 | Discharge: 2021-10-16 | Disposition: A | Discharge status: Home / Self Care | Payer: BC Managed Care – PPO | Attending: Emergency Medicine | Admitting: Emergency Medicine

## 2021-10-15 ENCOUNTER — Other Ambulatory Visit: Payer: Self-pay

## 2021-10-15 DIAGNOSIS — R300 Dysuria: Secondary | ICD-10-CM | POA: Diagnosis not present

## 2021-10-15 DIAGNOSIS — Z91010 Allergy to peanuts: Secondary | ICD-10-CM | POA: Insufficient documentation

## 2021-10-15 DIAGNOSIS — N3000 Acute cystitis without hematuria: Secondary | ICD-10-CM | POA: Diagnosis not present

## 2021-10-15 LAB — URINALYSIS, ROUTINE W REFLEX MICROSCOPIC
Bacteria, UA: NONE SEEN
Bilirubin Urine: NEGATIVE
Glucose, UA: NEGATIVE mg/dL
Ketones, ur: NEGATIVE mg/dL
Nitrite: NEGATIVE
Protein, ur: NEGATIVE mg/dL
Specific Gravity, Urine: 1.009 (ref 1.005–1.030)
pH: 6 (ref 5.0–8.0)

## 2021-10-15 LAB — PREGNANCY, URINE: Preg Test, Ur: NEGATIVE

## 2021-10-15 NOTE — ED Triage Notes (Signed)
Patient coming to ED for evaluation of dysuria.  Reports symptoms started today.  States "it feels like I am peeing hot water."  C/o lower abdominal discomfort

## 2021-10-15 NOTE — ED Provider Triage Note (Cosign Needed Addendum)
Emergency Medicine Provider Triage Evaluation Note  Ashley Fletcher , a 26 y.o. female  was evaluated in triage.  Pt complains of concerns for dysuria onset today.  Notes that it feels like she is peeing hot water.  Has associated urgency and frequency. Also notes suprapubic abdominal pain. No meds tried prior to arrival.  Denies hematuria, fever, nausea, vomiting.  Review of Systems  Positive: As per HPI Negative:   Physical Exam  BP 124/82 (BP Location: Left Arm)   Pulse (!) 105   Temp 99.3 F (37.4 C) (Oral)   Resp 16   Ht 5\' 6"  (1.676 m)   Wt 80.3 kg   SpO2 99%   BMI 28.57 kg/m  Gen:   Awake, no distress   Resp:  Normal effort  MSK:   Moves extremities without difficulty  Other:  No abdominal tenderness to palpation  Medical Decision Making  Medically screening exam initiated at 10:24 PM.  Appropriate orders placed.  Maily M Braun was informed that the remainder of the evaluation will be completed by another provider, this initial triage assessment does not replace that evaluation, and the importance of remaining in the ED until their evaluation is complete.  Work-up initiated   Zaydon Kinser A, PA-C 10/15/21 2229   11:57 PM -discussed with patient urine findings.  Patient notes that she is also having burning to her genital region outside of urination.  Discussed with patient that she will need a pelvic exam to further evaluate the area. Patient placed in room in back.   Varian Innes A, PA-C 10/15/21 2357

## 2021-10-16 ENCOUNTER — Other Ambulatory Visit: Payer: Self-pay

## 2021-10-16 LAB — WET PREP, GENITAL
Clue Cells Wet Prep HPF POC: NONE SEEN
Sperm: NONE SEEN
Trich, Wet Prep: NONE SEEN
WBC, Wet Prep HPF POC: <10 (ref <10)
Yeast Wet Prep HPF POC: NONE SEEN

## 2021-10-16 LAB — GC/CHLAMYDIA PROBE AMP (~~LOC~~) NOT AT ARMC
Chlamydia: NEGATIVE
Comment: NEGATIVE
Comment: NORMAL
Neisseria Gonorrhea: NEGATIVE

## 2021-10-16 MED ORDER — CEPHALEXIN 500 MG PO CAPS
500.0000 mg | ORAL_CAPSULE | Freq: Two times a day (BID) | ORAL | 0 refills | Status: DC
  Filled 2021-10-16: qty 14, 7d supply, fill #0

## 2021-10-16 MED ORDER — CEPHALEXIN 500 MG PO CAPS: 500.0000 mg | ORAL_CAPSULE | Freq: Two times a day (BID) | ORAL | 0 refills | Status: AC

## 2021-10-16 NOTE — ED Provider Notes (Signed)
[facility]-EMERGENCY DEPT Provider Note   CSN: [id number] Arrival date & time: 10/15/21  2151     History  Chief Complaint  Patient presents with   Dysuria    Ashley Fletcher is a 26 y.o. female.  HPI  Presents with complaints of dysuria, patient states this started today, feels a burning-like sensation especially with urination, no dysuria or hematuria, states that she will have intermittent vaginal bleeding this morning for about 1 month's time, has an IUD in place, has had it for one year, she does note that her menstrual cycles have been irregular, no history of ovarian torsion's or ovarian cyst, denies any vaginal discharge no pelvic pain, denies any flank pain or back pain.  She is having no other complaints.    Home Medications Prior to Admission medications   Medication Sig Start Date End Date Taking? Authorizing Provider  cephALEXin (KEFLEX) 500 MG capsule Take 1 capsule (500 mg total) by mouth 2 (two) times daily for 7 days. 10/16/21 10/23/21 Yes Carroll Sage, PA-C  hydrocortisone (ANUSOL-HC) 25 MG suppository Place 1 suppository (25 mg total) rectally 2 (two) times daily. Patient not taking: Reported on 10/16/2021 01/24/21   Garlon Hatchet, PA-C  lidocaine (XYLOCAINE) 2 % jelly Apply 1 application topically as needed (rectal pain). Patient not taking: Reported on 10/16/2021 01/24/21   Garlon Hatchet, PA-C  ondansetron (ZOFRAN) 4 MG tablet Take 1 tablet (4 mg total) by mouth every 8 (eight) hours as needed for nausea or vomiting. Patient not taking: Reported on 10/16/2021 12/13/20   Venita Lick, MD  omeprazole (PRILOSEC) 20 MG capsule Take 1 capsule (20 mg total) by mouth daily. Patient not taking: Reported on 05/31/2019 12/03/17 05/31/19  Cain Saupe, MD      Allergies    Peanut-containing drug products    Review of Systems   Review of Systems  Constitutional:  Negative for chills and fever.  Respiratory:  Negative for shortness of breath.    Cardiovascular:  Negative for chest pain.  Gastrointestinal:  Negative for abdominal pain.  Genitourinary:  Positive for dysuria and vaginal bleeding. Negative for menstrual problem, pelvic pain, vaginal discharge and vaginal pain.  Neurological:  Negative for headaches.    Physical Exam Updated Vital Signs BP 124/82 (BP Location: Left Arm)   Pulse (!) 105   Temp 99.3 F (37.4 C) (Oral)   Resp 16   Ht 5\' 6"  (1.676 m)   Wt 80.3 kg   SpO2 99%   BMI 28.57 kg/m  Physical Exam Vitals and nursing note reviewed. Exam conducted with a chaperone present.  Constitutional:      General: She is not in acute distress.    Appearance: She is not ill-appearing.  HENT:     Head: Normocephalic and atraumatic.     Nose: No congestion.  Eyes:     Conjunctiva/sclera: Conjunctivae normal.  Cardiovascular:     Rate and Rhythm: Normal rate and regular rhythm.     Pulses: Normal pulses.     Heart sounds: No murmur heard.    No friction rub. No gallop.  Pulmonary:     Effort: No respiratory distress.     Breath sounds: No wheezing, rhonchi or rales.  Abdominal:     Palpations: Abdomen is soft.     Tenderness: There is no abdominal tenderness. There is no right CVA tenderness or left CVA tenderness.  Genitourinary:    Comments: Chaperone present pelvic exam was performed, external genitalia was  unremarkable, no discharge or vaginal bleeding present.  Vaginal canal was patent and open, no erythema no lesions present, cervix was visualized, no lesions present, os was closed, no blood noted in the posterior vault no other gross abnormalities present.  She is not on tender during the exam. Skin:    General: Skin is warm and dry.  Neurological:     Mental Status: She is alert.  Psychiatric:        Mood and Affect: Mood normal.     ED Results / Procedures / Treatments   Labs (all labs ordered are listed, but only abnormal results are displayed) Labs Reviewed  URINALYSIS, ROUTINE W REFLEX  MICROSCOPIC - Abnormal; Notable for the following components:      Result Value   Color, Urine STRAW (*)    Hgb urine dipstick MODERATE (*)    Leukocytes,Ua MODERATE (*)    All other components within normal limits  WET PREP, GENITAL  URINE CULTURE  PREGNANCY, URINE  GC/CHLAMYDIA PROBE AMP ([place]) NOT AT [facility]    EKG None  Radiology No results found.  Procedures Procedures    Medications Ordered in ED Medications - No data to display  ED Course/ Medical Decision Making/ A&P                           Medical Decision Making Amount and/or Complexity of Data Reviewed Labs: ordered.   This patient presents to the ED for concern of dysuria, this involves an extensive number of treatment options, and is a complaint that carries with it a high risk of complications and morbidity.  The differential diagnosis includes UTI, pyelo-, kidney stone, PID    Additional history obtained:  Additional history obtained from N/A External records from outside source obtained and reviewed including previous OB/GYN wound notes   Co morbidities that complicate the patient evaluation  N/A  Social Determinants of Health:  N/A    Lab Tests:  I Ordered, and personally interpreted labs.  The pertinent results include: UA shows moderate leukocytes white blood cells no bacteria, wet prep negative, urine pregnancy negative GC chlamydia pending   Imaging Studies ordered:  I ordered imaging studies including N/A I independently visualized and interpreted imaging which showed N/A I agree with the radiologist interpretation   Cardiac Monitoring:  The patient was maintained on a cardiac monitor.  I personally viewed and interpreted the cardiac monitored which showed an underlying rhythm of: N/A   Medicines ordered and prescription drug management:  I ordered medication including N/A I have reviewed the patients home medicines and have made adjustments as needed  Critical  Interventions:  N/A   Reevaluation:  Presents with dysuria, UA was obtained by triage slightly abnormal, will culture urine, also perform pelvic exam for rule out of PID.  Update patient on lab work has no complaints agreement with plan discharge at this time.  Consultations Obtained:  N/A   Test Considered:  Transvaginal ultrasound-defer to my suspicion for ovarian torsion is very low at this time, having no pelvic pain she is nontender during my examination.    Rule out Low suspicion for ectopic pregnancy as urine pregnancy is negative.  Low suspicion for PID she does not endorse any vaginal discharge, no evidence of infection during pelvic exam.  I have low suspicion for pyelo-, kidney stone she has no flank tenderness, no hematuria seen on UA.    Dispostion and problem list  After consideration  of the diagnostic results and the patients response to treatment, I feel that the patent would benefit from discharge.  Dysuria-likely UTI, urine cultures are pending at this time, will start her on antibiotics, follow-up with PCP for further evaluation and strict return precautions.            Final Clinical Impression(s) / ED Diagnoses Final diagnoses:  Acute cystitis without hematuria    Rx / DC Orders ED Discharge Orders          Ordered    cephALEXin (KEFLEX) 500 MG capsule  2 times daily        10/16/21 0150              Carroll Sage, PA-C 10/16/21 0150    Dione Booze, MD 10/16/21 [phone]

## 2021-10-16 NOTE — Discharge Instructions (Signed)
Started on antibiotic please take as prescribed.  Please follow-up with your OB and/or PCP for reassessment of your UTI.  Come back to the emergency department if you develop chest pain, shortness of breath, severe abdominal pain, uncontrolled nausea, vomiting, diarrhea.

## 2021-10-17 LAB — URINE CULTURE: Culture: 10000 — AB

## 2021-10-18 ENCOUNTER — Telehealth (HOSPITAL_BASED_OUTPATIENT_CLINIC_OR_DEPARTMENT_OTHER): Payer: Self-pay | Admitting: Emergency Medicine

## 2021-10-18 NOTE — Telephone Encounter (Signed)
Post ED Visit - Positive Culture Follow-up  Culture report reviewed by antimicrobial stewardship pharmacist: [facility] [x]  , Pharm.D. []  Enzo Bi, Pharm.D., BCPS AQ-ID []  , Pharm.D., BCPS []  Celedonio Miyamoto, Pharm.D., BCPS []  [place], Garvin Fila.D., BCPS, AAHIVP []  , Pharm.D., BCPS, AAHIVP []  Georgina Pillion, PharmD, BCPS []  , PharmD, BCPS []  [place], PharmD, BCPS []  [place], PharmD []  , PharmD, BCPS []  Estella Husk, PharmD  Pharmacy Team []  Lysle Pearl, PharmD []  , PharmD []  Phillips Climes, PharmD []  , Rph []  Agapito Games) , PharmD []  Verlan Friends, PharmD []  , PharmD []  Mervyn Gay, PharmD []  , PharmD []  Vinnie Level, PharmD []  [facility] [place], PharmD []  , PharmD []  Len Childs, PharmD   Positive urine culture Treated with Cephalexin, organism sensitive to the same and no further patient follow-up is required at this time.  Shamyra Farias 10/18/2021, 12:09 PM

## 2021-10-28 DIAGNOSIS — N760 Acute vaginitis: Secondary | ICD-10-CM | POA: Diagnosis not present

## 2021-10-28 DIAGNOSIS — R300 Dysuria: Secondary | ICD-10-CM | POA: Diagnosis not present

## 2021-11-08 DIAGNOSIS — N9089 Other specified noninflammatory disorders of vulva and perineum: Secondary | ICD-10-CM | POA: Diagnosis not present

## 2021-11-08 DIAGNOSIS — R300 Dysuria: Secondary | ICD-10-CM | POA: Diagnosis not present

## 2021-11-08 DIAGNOSIS — N898 Other specified noninflammatory disorders of vagina: Secondary | ICD-10-CM | POA: Diagnosis not present
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
## 2022-02-03 DIAGNOSIS — Z6829 Body mass index (BMI) 29.0-29.9, adult: Secondary | ICD-10-CM | POA: Diagnosis not present

## 2022-02-03 DIAGNOSIS — E663 Overweight: Secondary | ICD-10-CM | POA: Diagnosis not present

## 2022-02-03 DIAGNOSIS — J208 Acute bronchitis due to other specified organisms: Secondary | ICD-10-CM | POA: Diagnosis not present

## 2022-04-10 DIAGNOSIS — D509 Iron deficiency anemia, unspecified: Secondary | ICD-10-CM | POA: Diagnosis not present

## 2022-04-10 DIAGNOSIS — E8889 Other specified metabolic disorders: Secondary | ICD-10-CM | POA: Diagnosis not present

## 2022-04-10 DIAGNOSIS — E559 Vitamin D deficiency, unspecified: Secondary | ICD-10-CM | POA: Diagnosis not present

## 2022-04-10 DIAGNOSIS — G43109 Migraine with aura, not intractable, without status migrainosus: Secondary | ICD-10-CM | POA: Diagnosis not present

## 2022-04-10 DIAGNOSIS — Z1331 Encounter for screening for depression: Secondary | ICD-10-CM | POA: Diagnosis not present

## 2022-04-16 DIAGNOSIS — N898 Other specified noninflammatory disorders of vagina: Secondary | ICD-10-CM | POA: Diagnosis not present

## 2022-04-24 DIAGNOSIS — Z9189 Other specified personal risk factors, not elsewhere classified: Secondary | ICD-10-CM | POA: Diagnosis not present

## 2022-04-24 DIAGNOSIS — R948 Abnormal results of function studies of other organs and systems: Secondary | ICD-10-CM | POA: Diagnosis not present

## 2022-04-24 DIAGNOSIS — E538 Deficiency of other specified B group vitamins: Secondary | ICD-10-CM | POA: Diagnosis not present

## 2022-04-24 DIAGNOSIS — E559 Vitamin D deficiency, unspecified: Secondary | ICD-10-CM | POA: Diagnosis not present

## 2022-04-24 DIAGNOSIS — D509 Iron deficiency anemia, unspecified: Secondary | ICD-10-CM | POA: Diagnosis not present

## 2022-05-08 DIAGNOSIS — Z8639 Personal history of other endocrine, nutritional and metabolic disease: Secondary | ICD-10-CM | POA: Diagnosis not present

## 2022-05-08 DIAGNOSIS — R519 Headache, unspecified: Secondary | ICD-10-CM | POA: Diagnosis not present

## 2022-05-08 DIAGNOSIS — E663 Overweight: Secondary | ICD-10-CM | POA: Diagnosis not present

## 2022-05-08 DIAGNOSIS — Z6827 Body mass index (BMI) 27.0-27.9, adult: Secondary | ICD-10-CM | POA: Diagnosis not present

## 2022-05-20 DIAGNOSIS — N760 Acute vaginitis: Secondary | ICD-10-CM | POA: Diagnosis not present

## 2022-05-20 DIAGNOSIS — Z6827 Body mass index (BMI) 27.0-27.9, adult: Secondary | ICD-10-CM | POA: Diagnosis not present

## 2022-05-20 DIAGNOSIS — R519 Headache, unspecified: Secondary | ICD-10-CM | POA: Diagnosis not present

## 2022-05-20 DIAGNOSIS — E663 Overweight: Secondary | ICD-10-CM | POA: Diagnosis not present

## 2022-08-17 ENCOUNTER — Other Ambulatory Visit: Payer: Self-pay

## 2022-08-17 ENCOUNTER — Encounter (HOSPITAL_COMMUNITY): Payer: Self-pay | Admitting: Emergency Medicine

## 2022-08-17 ENCOUNTER — Emergency Department (HOSPITAL_COMMUNITY)
Admission: EM | Admit: 2022-08-17 | Discharge: 2022-08-17 | Disposition: A | Discharge status: Home / Self Care | Payer: Self-pay | Attending: Emergency Medicine | Admitting: Emergency Medicine

## 2022-08-17 DIAGNOSIS — Z91010 Allergy to peanuts: Secondary | ICD-10-CM | POA: Insufficient documentation

## 2022-08-17 DIAGNOSIS — G43909 Migraine, unspecified, not intractable, without status migrainosus: Secondary | ICD-10-CM | POA: Insufficient documentation

## 2022-08-17 DIAGNOSIS — G43009 Migraine without aura, not intractable, without status migrainosus: Secondary | ICD-10-CM

## 2022-08-17 LAB — I-STAT CHEM 8, ED
BUN: 7 mg/dL (ref 6–20)
Calcium, Ion: 1.21 mmol/L (ref 1.15–1.40)
Chloride: 104 mmol/L (ref 98–111)
Creatinine, Ser: 0.4 mg/dL — ABNORMAL LOW (ref 0.44–1.00)
Glucose, Bld: 106 mg/dL — ABNORMAL HIGH (ref 70–99)
HCT: 35 % — ABNORMAL LOW (ref 36.0–46.0)
Hemoglobin: 11.9 g/dL — ABNORMAL LOW (ref 12.0–15.0)
Potassium: 3.4 mmol/L — ABNORMAL LOW (ref 3.5–5.1)
Sodium: 139 mmol/L (ref 135–145)
TCO2: 22 mmol/L (ref 22–32)

## 2022-08-17 LAB — I-STAT BETA HCG BLOOD, ED (MC, WL, AP ONLY): I-stat hCG, quantitative: <5 m[IU]/mL (ref <5)

## 2022-08-17 MED ORDER — DIPHENHYDRAMINE HCL 50 MG/ML IJ SOLN
25.0000 mg | Freq: Once | INTRAMUSCULAR | Status: AC
  Administered 2022-08-17: 25 mg via INTRAVENOUS
  Filled 2022-08-17: qty 1

## 2022-08-17 MED ORDER — PROCHLORPERAZINE EDISYLATE 10 MG/2ML IJ SOLN
10.0000 mg | Freq: Once | INTRAMUSCULAR | Status: AC
  Administered 2022-08-17: 10 mg via INTRAVENOUS
  Filled 2022-08-17: qty 2

## 2022-08-17 MED ORDER — ONDANSETRON HCL 4 MG PO TABS
4.0000 mg | ORAL_TABLET | Freq: Four times a day (QID) | ORAL | 0 refills | Status: AC
  Filled 2022-08-17: qty 12, 8d supply, fill #0

## 2022-08-17 NOTE — Discharge Instructions (Addendum)
Please follow-up with your primary care provider regarding recent symptoms and ER visit.  Today you were treated for a migraine and improved with medication.  You Ashley Fletcher take Tylenol as needed for your migraines.  I have also prescribed for you Zofran which is a nausea medication.  You need to speak to your primary care provider to determine if you need to be placed on migraine medication.  If symptoms worsen please return to ER.

## 2022-08-17 NOTE — ED Provider Notes (Signed)
[place] EMERGENCY DEPARTMENT AT [facility] Provider Note   CSN: [id number] Arrival date & time: 08/17/22  4098     History  Chief Complaint  Patient presents with   Headache    Ashley Fletcher is a 27 y.o. female presented with headache that has been present since yesterday.  Patient states that she was having a mild headache throughout the day yesterday however at 2 AM the headache became unbearable.  Patient tried Tylenol but puked The Tylenol back up.  Patient states the headache is worse in the left side but notices on both sides.  Patient states that she feels photophobic and is tearful.  Patient denies any sick contacts and is not currently lactating.  Patient does not want a CT scan patient is also having to go away.  Patient with episode of nonbloody emesis since then has not been nauseous.  Patient denies new onset weakness, neck stiffness, shortness of breath, chest pain, fevers, recent illness, vision loss, jaw claudication  Home Medications Prior to Admission medications   Medication Sig Start Date End Date Taking? Authorizing Provider  ondansetron (ZOFRAN) 4 MG tablet Take 1 tablet (4 mg total) by mouth every 6 (six) hours. 08/17/22  Yes Netta Corrigan, PA-C  omeprazole (PRILOSEC) 20 MG capsule Take 1 capsule (20 mg total) by mouth daily. Patient not taking: Reported on 05/31/2019 12/03/17 05/31/19  Cain Saupe, MD      Allergies    Peanut-containing drug products    Review of Systems   Review of Systems  Neurological:  Positive for headaches.    Physical Exam Updated Vital Signs BP 115/84 (BP Location: Right Arm)   Pulse (!) 101   Temp 97.6 F (36.4 C)   Resp 18   Wt 74.8 kg   LMP 08/04/2022   SpO2 100%   BMI 26.63 kg/m  Physical Exam Constitutional:      General: She is not in acute distress.    Comments: Tearful  HENT:     Head:     Comments: No temporal artery tenderness Eyes:     Extraocular Movements: Extraocular movements intact.      Conjunctiva/sclera: Conjunctivae normal.     Pupils: Pupils are equal, round, and reactive to light.     Comments: Mildly photophobic  Neck:     Comments: No meningismus noted Cardiovascular:     Rate and Rhythm: Normal rate and regular rhythm.     Pulses: Normal pulses.     Heart sounds: Normal heart sounds.  Pulmonary:     Effort: Pulmonary effort is normal. No respiratory distress.     Breath sounds: Normal breath sounds.  Musculoskeletal:     Cervical back: Normal range of motion and neck supple. No rigidity or tenderness.  Neurological:     Mental Status: She is alert and oriented to person, place, and time.     Sensory: Sensation is intact.     Motor: Motor function is intact.     Coordination: Coordination is intact.     Gait: Gait is intact.     Comments: Vision grossly intact Sensation intact in all 4 limbs      ED Results / Procedures / Treatments   Labs (all labs ordered are listed, but only abnormal results are displayed) Labs Reviewed  I-STAT CHEM 8, ED - Abnormal; Notable for the following components:      Result Value   Potassium 3.4 (*)    Creatinine, Ser 0.40 (*)  Glucose, Bld 106 (*)    Hemoglobin 11.9 (*)    HCT 35.0 (*)    All other components within normal limits  I-STAT BETA HCG BLOOD, ED ([facility], [facility], [facility] ONLY)     EKG None  Radiology No results found.  Procedures Procedures    Medications Ordered in ED Medications  prochlorperazine (COMPAZINE) injection 10 mg (10 mg Intravenous Given 08/17/22 0706)  diphenhydrAMINE (BENADRYL) injection 25 mg (25 mg Intravenous Given 08/17/22 0707)    ED Course/ Medical Decision Making/ A&P                             Medical Decision Making Amount and/or Complexity of Data Reviewed Radiology: ordered.   Ashley Fletcher 28 y.o. presented today for headache. Working DDx that I considered at this time includes, but not limited to, tension headache, migraine intracranial mass, intracranial hemorrhage,  intracranial infection including meningitis vs encephalitis, GCA, trigeminal neuralgia.  R/o DDx: Tension headache:  less likely due to history of present illness and physical exam findings SAH/ICH: Timeline and slow onset is not consistent with SAH/ICH  GCA: Age and description of pain is not consistent with GCA  Meningitis/encephalitis: Lack of fever, meningismus is not consistent Intracranial Mass:  less likely due to history of present illness and physical exam findings Trigeminal Neuralgia: headache does not meet this description Intracranial Infection:  less likely due to history of present illness and physical exam findings, no fevers  Review of prior external notes: 10/15/2021 ED  Unique Tests and My Interpretation:   Discussion with Independent Historian: None  Discussion of Management of Tests: None  Risk: Medium: prescription drug management  Risk Stratification Score: None  Plan: Patient presented for HA. On exam patient was in no acute distress and stable vitals. Physical was Tearful but had reassuring physical exam including neuroexam.  Patient states that she does not want labs or imaging and that she has a headache to go away at first.  Patient is not currently lactating or pregnant. Patient will be given Compazine and Benadryl for HA treatment. Patient stable at this time.  On recheck patient stated that her headache drastically improved after the medication.  Patient was no longer tearful and resting comfortably.  Patient still verbalized that she does not want to have blood drawn or a CT scan as she feels these are necessary at this time.  I spoke to the patient about the risk and benefit of doing further workup and patient with full decision-making capacity verbalized that she accepts the risks which include complications of her symptoms and incomplete workup.  Patient states that she feels comfortable being discharged.  Patient given Zofran for her nausea and encouraged  to follow-up with her primary care provider to determine if she needs to be placed on migraine medication.  Patient states that she has a ride home and that she will call them to be picked up.  Patient was given return precautions. Patient stable for discharge at this time.  Patient verbalized understanding of plan.         Final Clinical Impression(s) / ED Diagnoses Final diagnoses:  Migraine without aura and without status migrainosus, not intractable    Rx / DC Orders ED Discharge Orders          Ordered    ondansetron (ZOFRAN) 4 MG tablet  Every 6 hours        08/17/22 0751  Netta Corrigan, PA-C 08/17/22 0756    Linwood Dibbles, MD 08/18/22 [phone]

## 2022-08-17 NOTE — ED Triage Notes (Signed)
Pt in with frontal headache onset at 0200 when she woke up. Pt states she took Tylenol at 0400 and then vomited soon afterward. C/o photophobia and blurred vision. 10/10 pain

## 2022-08-18 ENCOUNTER — Other Ambulatory Visit: Payer: Self-pay

## 2022-08-25 ENCOUNTER — Other Ambulatory Visit: Payer: Self-pay

## 2022-09-16 ENCOUNTER — Inpatient Hospital Stay: Payer: Self-pay | Attending: Hematology and Oncology | Admitting: Hematology and Oncology

## 2022-09-16 ENCOUNTER — Inpatient Hospital Stay: Payer: Self-pay

## 2022-09-16 ENCOUNTER — Other Ambulatory Visit: Payer: Self-pay

## 2022-09-16 VITALS — BP 121/60 | HR 91 | Temp 97.7°F | Resp 16 | Wt 171.3 lb

## 2022-09-16 DIAGNOSIS — D509 Iron deficiency anemia, unspecified: Secondary | ICD-10-CM | POA: Insufficient documentation

## 2022-09-16 DIAGNOSIS — G43909 Migraine, unspecified, not intractable, without status migrainosus: Secondary | ICD-10-CM | POA: Insufficient documentation

## 2022-09-16 NOTE — Progress Notes (Unsigned)
[facility] CONSULT NOTE  Patient Care Team: [facility], [facility] [place] as PCP - General (Family Medicine) Karie Soda, MD as Consulting Physician (General Surgery) Venita Lick, MD as Consulting Physician (Spine Surgery)  CHIEF COMPLAINTS/PURPOSE OF CONSULTATION:  IDA  ASSESSMENT & PLAN:  This is a very pleasant 27 year old female patient with no significant past medical history referred to hematology for recommendations regarding iron deficiency anemia.  She started noticing severe migraine headaches, has never been officially diagnosed with migraine, noted some pica ice craving, occasional dizziness and went to her primary care physician for some additional investigation. She had some labs at her PCPs office which showed a hemoglobin of 11.7 g, mild microcytosis, ferritin of 10.7, normal white blood cell count and platelet count.  She has not started taking oral iron supplementation.  Since she has mild anemia, we have discussed about considering taking oral iron supplementation ferrous sulfate 65/325 once daily and return to clinic in about 8 weeks.  If she did not have any improvement in her iron deficiency or her symptoms with oral iron supplementation then we can certainly consider a trial of IV iron. She is completely agreeable to this plan.  She will return to clinic with repeat labs.  She was encouraged to contact us sooner with any new questions or concerns.  HISTORY OF PRESENTING ILLNESS:  Ashley Fletcher 27 y.o. female is here because of IDA.  This is a very pleasant 28 yr old female patient with no significant PMH referred to hematology for IDA. Patient complains of severe migraine headaches, occasional dizziness, pica and had some labs drawn which suggested iron deficiency. She hasn't been taking oral iron yet, she was hoping to meet [place] before she starts it. She does have regular menstrual cycles, has one child and breast fed the child. She didn't  take prenatals during breast feeding. She denies any hematochezia or melena.  Rest of the pertinent 10 point ROS reviewed and negative REVIEW OF SYSTEMS:   Constitutional: Denies fevers, chills or abnormal night sweats Eyes: Denies blurriness of vision, double vision or watery eyes Ears, nose, mouth, throat, and face: Denies mucositis or sore throat Respiratory: Denies cough, dyspnea or wheezes Cardiovascular: Denies palpitation, chest discomfort or lower extremity swelling Gastrointestinal:  Denies nausea, heartburn or change in bowel habits Skin: Denies abnormal skin rashes Lymphatics: Denies new lymphadenopathy or easy bruising Neurological:Denies numbness, tingling or new weaknesses Behavioral/Psych: Mood is stable, no new changes  All other systems were reviewed with the patient and are negative.  MEDICAL HISTORY:  No past medical history on file.  SURGICAL HISTORY: Past Surgical History:  Procedure Laterality Date   LUMBAR LAMINECTOMY/DECOMPRESSION MICRODISCECTOMY Left 11/22/2020   Procedure: LUMBAR LAMINECTOMY/DECOMPRESSION MICRODISCECTOMY 1 LEVEL (LEFT L4-5 DISCECTOMY);  Surgeon: Venita Lick, MD;  Location: [facility] OR;  Service: Orthopedics;  Laterality: Left;   LUMBAR LAMINECTOMY/DECOMPRESSION MICRODISCECTOMY N/A 12/13/2020   Procedure: LUMBAR FOUR THROUGH FIVE LEFT REVISION DISCECTOMY WITH REPAIR OF CEREBRAL SPINAL FLUID LEAK;  Surgeon: Venita Lick, MD;  Location: [facility] OR;  Service: Orthopedics;  Laterality: N/A;    SOCIAL HISTORY: Social History   Socioeconomic History   Marital status: Married    Spouse name: Not on file   Number of children: Not on file   Years of education: Not on file   Highest education level: Not on file  Occupational History   Not on file  Tobacco Use   Smoking status: Never   Smokeless tobacco: Never  Vaping Use  Vaping Use: Never used  Substance and Sexual Activity   Alcohol use: No   Drug use: No   Sexual activity: Yes    Birth  control/protection: I.U.D.    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  MEDICATIONS:  Current Outpatient Medications  Medication Sig Dispense Refill   ondansetron (ZOFRAN) 4 MG tablet Take 1 tablet (4 mg total) by mouth every 6 (six) hours. 12 tablet 0   No current facility-administered medications for this visit.     PHYSICAL EXAMINATION: ECOG PERFORMANCE STATUS: 0 - Asymptomatic  Vitals:   09/16/22 1404  BP: 121/60  Pulse: 91  Resp: 16  Temp: 97.7 F (36.5 C)  SpO2: 100%   Filed Weights   09/16/22 1404  Weight: 171 lb 4.8 oz (77.7 kg)    GENERAL:alert, no distress and comfortable SKIN: skin color, texture, turgor are normal, no rashes or significant lesions EYES: normal, conjunctiva are pink and non-injected, sclera clear OROPHARYNX:no exudate, no erythema and lips, buccal mucosa, and tongue normal  NECK: supple, thyroid normal size, non-tender, without nodularity LYMPH:  no palpable lymphadenopathy in the cervical, axillary  LUNGS: clear to auscultation and percussion with normal breathing effort HEART: regular rate & rhythm and no murmurs and no lower extremity edema ABDOMEN:abdomen soft, non-tender and normal bowel sounds Musculoskeletal:no cyanosis of digits and no clubbing  PSYCH: alert & oriented x 3 with fluent speech NEURO: no focal motor/sensory deficits  LABORATORY DATA:  I have reviewed the data as  listed Lab Results  Component Value Date   WBC 5.4 12/13/2020   HGB 11.9 (L) 08/17/2022   HCT 35.0 (L) 08/17/2022   MCV 84.3 12/13/2020   PLT 268 12/13/2020     Chemistry      Component Value Date/Time   NA 139 08/17/2022 0719   NA 140 12/03/2017 1715   K 3.4 (L) 08/17/2022 0719   CL 104 08/17/2022 0719   CO2 23 12/13/2020 0557   BUN 7 08/17/2022 0719   BUN 8 12/03/2017 1715   CREATININE 0.40 (L) 08/17/2022 0719      Component Value Date/Time   CALCIUM 9.2 12/13/2020 0557   ALKPHOS 64 02/10/2019 0505   AST 19 02/10/2019 0505   ALT 23 02/10/2019 0505   BILITOT 0.3 02/10/2019 0505       RADIOGRAPHIC STUDIES: I have personally reviewed the radiological images as listed and agreed with the findings in the report. No results found.  All questions were answered. The patient knows to call the clinic with any problems, questions or concerns. I spent 30 minutes in the care of this patient including H and P, review of records, counseling and coordination of care.     Rachel Moulds, MD 09/16/2022 2:20 PM

## 2022-11-14 ENCOUNTER — Telehealth: Payer: Self-pay | Admitting: Adult Health

## 2022-11-14 NOTE — Telephone Encounter (Signed)
Rescheduled appointments per provider PAL. Left voicemail for the patients spouse with appointment details. Did reach out to the nu,ber listed for the patient and the call did not go through.

## 2022-11-20 ENCOUNTER — Ambulatory Visit: Payer: Self-pay | Admitting: Adult Health

## 2022-11-20 ENCOUNTER — Other Ambulatory Visit: Payer: Self-pay

## 2022-12-05 ENCOUNTER — Other Ambulatory Visit: Payer: Self-pay | Admitting: *Deleted

## 2022-12-05 DIAGNOSIS — D509 Iron deficiency anemia, unspecified: Secondary | ICD-10-CM

## 2022-12-08 ENCOUNTER — Inpatient Hospital Stay: Payer: Self-pay | Admitting: Adult Health

## 2022-12-08 ENCOUNTER — Inpatient Hospital Stay: Payer: Self-pay | Attending: Hematology and Oncology

## 2023-05-04 ENCOUNTER — Inpatient Hospital Stay (HOSPITAL_COMMUNITY)
Admission: AD | Admit: 2023-05-04 | Discharge: 2023-05-04 | Disposition: A | Discharge status: Home / Self Care | Payer: No Typology Code available for payment source | Attending: Obstetrics and Gynecology | Admitting: Obstetrics and Gynecology

## 2023-05-04 ENCOUNTER — Encounter (HOSPITAL_COMMUNITY): Payer: Self-pay | Admitting: Obstetrics and Gynecology

## 2023-05-04 DIAGNOSIS — O368120 Decreased fetal movements, second trimester, not applicable or unspecified: Secondary | ICD-10-CM | POA: Insufficient documentation

## 2023-05-04 DIAGNOSIS — Z3689 Encounter for other specified antenatal screening: Secondary | ICD-10-CM

## 2023-05-04 DIAGNOSIS — Z3A26 26 weeks gestation of pregnancy: Secondary | ICD-10-CM | POA: Diagnosis not present

## 2023-05-04 NOTE — MAU Note (Signed)
.  Ashley Fletcher is a 28 y.o. at [redacted]w[redacted]d here in MAU reporting: DFM since yesterday in the sense of different movements. She reports she usually feels strong kicks but reports it has been more of "flutters." She denies pain, VB, and LOF.   PNC with [facility]. Circumvallate placenta.  Onset of complaint: Yesterday Pain score: Denies pain.  Vitals:   05/04/23 1852  BP: 115/64  Pulse: 87  Resp: 16  Temp: 98.6 F (37 C)  SpO2: 100%     FHT: 145 initial external Lab orders placed from triage: none

## 2023-05-04 NOTE — MAU Provider Note (Signed)
History     CSN: [id number]  Arrival date and time: 05/04/23 1831   Event Date/Time   First Provider Initiated Contact with Patient 05/04/23 1923      Chief Complaint  Patient presents with   Decreased Fetal Movement   HPI [name] is a 28 y.o. year old G70P1001 female at [redacted]w[redacted]d weeks gestation who presents to MAU reporting DFM since yesterday. She reports she drank a "big Coke" and felt only flutters last night; different movements. She reports she usually feels strong kicks and a lot of kicks; "to the point that it is uncomfortable." She denies any pain, VB or LOF. Since being in MAU, she has felt the normal movements she is used to feeling.  She was just worried and wanted to get the baby checked out. She receives [facility] with [facility] d/t Circumvallate Placenta; next appt on 05/22/2023. She has an appt with [facility] on 05/14/2023. Her spouse is present and contributing to the history taking.   OB History     Gravida  2   Para  1   Term  1   Preterm      AB      Living  1      SAB      IAB      Ectopic      Multiple  0   Live Births  1           History reviewed. No pertinent past medical history.  Past Surgical History:  Procedure Laterality Date   LUMBAR LAMINECTOMY/DECOMPRESSION MICRODISCECTOMY Left 11/22/2020   Procedure: LUMBAR LAMINECTOMY/DECOMPRESSION MICRODISCECTOMY 1 LEVEL (LEFT L4-5 DISCECTOMY);  Surgeon: Venita Lick, MD;  Location: [facility] OR;  Service: Orthopedics;  Laterality: Left;   LUMBAR LAMINECTOMY/DECOMPRESSION MICRODISCECTOMY N/A 12/13/2020   Procedure: LUMBAR FOUR THROUGH FIVE LEFT REVISION DISCECTOMY WITH REPAIR OF CEREBRAL SPINAL FLUID LEAK;  Surgeon: Venita Lick, MD;  Location: [facility] OR;  Service: Orthopedics;  Laterality: N/A;    Family History  Problem Relation Age of Onset   Hypertension Mother    Diabetes Father    Hyperlipidemia Father    Hypertension Father     Social History   Tobacco Use    Smoking status: Never   Smokeless tobacco: Never  Vaping Use   Vaping status: Never Used  Substance Use Topics   Alcohol use: No   Drug use: No    Allergies:  Allergies  Allergen Reactions   Peanut-Containing Drug Products Hives and Rash    Medications Prior to Admission  Medication Sig Dispense Refill Last Dose/Taking   ondansetron (ZOFRAN) 4 MG tablet Take 1 tablet (4 mg total) by mouth every 6 (six) hours. 12 tablet 0     Review of Systems  Constitutional: Negative.   HENT: Negative.    Eyes: Negative.   Respiratory: Negative.    Cardiovascular: Negative.   Gastrointestinal: Negative.   Endocrine: Negative.   Genitourinary:        "Flutters only since yesterday"  Musculoskeletal: Negative.   Skin: Negative.   Allergic/Immunologic: Negative.   Neurological: Negative.   Hematological: Negative.   Psychiatric/Behavioral: Negative.     Physical Exam   Blood pressure 115/64, pulse 87, temperature 98.6 F (37 C), temperature source Oral, resp. rate 16, last menstrual period 08/04/2022, SpO2 100%, currently breastfeeding.  Physical Exam Vitals and nursing note reviewed.  Constitutional:      Appearance: Normal appearance. She is normal weight.  Cardiovascular:  Rate and Rhythm: Normal rate.  Pulmonary:     Effort: Pulmonary effort is normal.  Abdominal:     Palpations: Abdomen is soft.  Genitourinary:    Comments: Not indicated Musculoskeletal:        General: Normal range of motion.  Skin:    General: Skin is warm and dry.  Neurological:     Mental Status: She is alert and oriented to person, place, and time.  Psychiatric:        Mood and Affect: Mood normal.        Behavior: Behavior normal.        Thought Content: Thought content normal.        Judgment: Judgment normal.   REACTIVE NST - FHR: 145 bpm / moderate variability / accels present / decels absent / TOCO: none MAU Course  Procedures  MDM NST  Assessment and Plan  1. NST (non-stress  test) reactive (Primary) - Reassurance given that NST was normal with normal FM detected and audible  2. [redacted] weeks gestation of pregnancy   - Discharge patient - Keep scheduled appt with [facility] on 05/14/2023 and [facility] on 05/22/2023. - Patient verbalized an understanding of the plan of care and agrees.   Ashley Fletcher, CNM 05/04/2023, 7:23 PM

## 2023-06-14 ENCOUNTER — Inpatient Hospital Stay (HOSPITAL_COMMUNITY)
Admission: AD | Admit: 2023-06-14 | Discharge: 2023-06-15 | Disposition: A | Discharge status: Home / Self Care | Attending: Obstetrics and Gynecology | Admitting: Obstetrics and Gynecology

## 2023-06-14 ENCOUNTER — Encounter (HOSPITAL_COMMUNITY): Payer: Self-pay | Admitting: Obstetrics and Gynecology

## 2023-06-14 DIAGNOSIS — M869 Osteomyelitis, unspecified: Secondary | ICD-10-CM | POA: Insufficient documentation

## 2023-06-14 DIAGNOSIS — O26893 Other specified pregnancy related conditions, third trimester: Secondary | ICD-10-CM | POA: Insufficient documentation

## 2023-06-14 DIAGNOSIS — M461 Sacroiliitis, not elsewhere classified: Secondary | ICD-10-CM | POA: Insufficient documentation

## 2023-06-14 DIAGNOSIS — B9689 Other specified bacterial agents as the cause of diseases classified elsewhere: Secondary | ICD-10-CM | POA: Insufficient documentation

## 2023-06-14 DIAGNOSIS — Z3A32 32 weeks gestation of pregnancy: Secondary | ICD-10-CM | POA: Insufficient documentation

## 2023-06-14 DIAGNOSIS — Z3689 Encounter for other specified antenatal screening: Secondary | ICD-10-CM | POA: Insufficient documentation

## 2023-06-14 MED ORDER — ACETAMINOPHEN 500 MG PO TABS
1000.0000 mg | ORAL_TABLET | Freq: Once | ORAL | Status: AC
  Administered 2023-06-15: 1000 mg via ORAL
  Filled 2023-06-14: qty 2

## 2023-06-14 MED ORDER — CYCLOBENZAPRINE HCL 5 MG PO TABS
10.0000 mg | ORAL_TABLET | Freq: Once | ORAL | Status: DC
  Filled 2023-06-14: qty 2

## 2023-06-14 NOTE — MAU Provider Note (Signed)
 Pelvic Pressure/Bruise/DFM   S [name] is a 28 y.o. G41P1001 pregnant female at [redacted]w[redacted]d who presents to MAU today with complaint of pelvic pressure and DFM. She states that she is having pelvic pressure and groin tenderness worse with sitting / standing.  More comfortable to lie down.  Reports suggestive numbness / weakness in RLL with LBP.  Reports braxton hicks ctx, denies LOF, VB.  Does stated usually baby is very active but has felt less frequent and weaker movements.  The pain began yesterday and DFM today.  Pt states she has to poke the abdomen to get movement from baby.     Receives care at [facility]. Prenatal records reviewed.  Pertinent items noted in HPI and remainder of comprehensive ROS otherwise negative.   O BP 113/73 (BP Location: Right Arm)   Pulse 94   Temp 98.1 F (36.7 C) (Oral)   Resp 17   Ht 5\' 6"  (1.676 m)   Wt 85.7 kg   LMP 08/04/2022   SpO2 99%   BMI 30.49 kg/m  Physical Exam Vitals and nursing note reviewed.  Constitutional:      General: She is not in acute distress.    Appearance: Normal appearance. She is not ill-appearing.  HENT:     Head: Normocephalic and atraumatic.     Right Ear: External ear normal.     Left Ear: External ear normal.     Nose: Nose normal. No congestion.     Mouth/Throat:     Mouth: Mucous membranes are moist.     Pharynx: Oropharynx is clear.  Eyes:     Extraocular Movements: Extraocular movements intact.     Conjunctiva/sclera: Conjunctivae normal.  Cardiovascular:     Rate and Rhythm: Normal rate.  Pulmonary:     Effort: Pulmonary effort is normal. No respiratory distress.  Abdominal:     General: There is no distension.     Palpations: Abdomen is soft.     Tenderness: There is no abdominal tenderness.     Comments: gravid  Genitourinary:    Comments: TTP over pubic symphysis and +Faber testing as well  Musculoskeletal:        General: No swelling. Normal range of motion.     Cervical back: Normal  range of motion.  Skin:    General: Skin is warm and dry.  Neurological:     Mental Status: She is alert and oriented to person, place, and time. Mental status is at baseline.     Motor: No weakness.     Gait: Gait normal.  Psychiatric:        Mood and Affect: Mood normal.        Behavior: Behavior normal.     NST: 140bpm, moderate variability, +accels, no decels, no ctx, adequate self indicated baby movements = reactive NST   MDM: MAU Course:  NST reactive and reassuring. PE positive for pubic symphysis and sacroiliac inflammation.  Pt driving herself so will give tylenol before muscle relaxer and gage improvement in symptoms. Significant improvement with heat packs and tylenol.  Sending with Flexeril PRN as well.  Stable for d/c.   AP #[redacted] weeks gestation #Osteitis pubis #Sacroiliitis  #Reactive NST  - sent with home exercise regimen - sent with flexeril PRN and heat packs   Discharge from MAU in stable condition with strict/usual precautions Follow up at [facility] as scheduled for ongoing prenatal care  Allergies as of 06/15/2023       Reactions  Peanut-containing Drug Products Hives, Rash        Medication List     TAKE these medications    cyclobenzaprine 10 MG tablet Commonly known as: FLEXERIL Take 1 tablet (10 mg total) by mouth 3 (three) times daily as needed for muscle spasms.   ondansetron 4 MG tablet Commonly known as: ZOFRAN Take 1 tablet (4 mg total) by mouth every 6 (six) hours.        Hessie Dibble, MD 06/15/2023 12:55 AM

## 2023-06-14 NOTE — MAU Note (Addendum)
.  Ashley Fletcher is a 28 y.o. at [redacted]w[redacted]d here in MAU reporting:pelvic pressure, groin area tenderness, uncomfortable to sit , stand, most comfortable to lie. Numbness/tingling/weakness in R leg, lower back pain. Denies cramping or contractions-"Braxton Willa Rough only" Denies SROM, vaginal bleeding or bloody show. Decreased fetal movement. Reports baby is usually very active but movements have been less frequent and decreased in strength.  Onset of complaint: first complaints began yesterday, decrease in movement began today.pt reports movement only with stimulation by "poking abdomen" Pain score: see note  Receives her care at [facility] patient has moved but did not transfer care  Vitals:   06/14/23 2248  BP: 113/73  Pulse: 94  Resp: 17  Temp: 98.1 F (36.7 C)  SpO2: 100%     FHT: 140  Lab orders placed from triage: NST

## 2023-06-15 DIAGNOSIS — B9689 Other specified bacterial agents as the cause of diseases classified elsewhere: Secondary | ICD-10-CM | POA: Diagnosis not present

## 2023-06-15 DIAGNOSIS — Z3A32 32 weeks gestation of pregnancy: Secondary | ICD-10-CM | POA: Diagnosis not present

## 2023-06-15 DIAGNOSIS — O26893 Other specified pregnancy related conditions, third trimester: Secondary | ICD-10-CM | POA: Diagnosis present

## 2023-06-15 DIAGNOSIS — Z3689 Encounter for other specified antenatal screening: Secondary | ICD-10-CM | POA: Diagnosis not present

## 2023-06-15 DIAGNOSIS — M461 Sacroiliitis, not elsewhere classified: Secondary | ICD-10-CM | POA: Diagnosis not present

## 2023-06-15 DIAGNOSIS — M869 Osteomyelitis, unspecified: Secondary | ICD-10-CM | POA: Diagnosis not present

## 2023-06-15 MED ORDER — CYCLOBENZAPRINE HCL 10 MG PO TABS: 10.0000 mg | ORAL_TABLET | Freq: Three times a day (TID) | ORAL | 0 refills | Status: AC | PRN

## 2023-06-15 MED ORDER — CYCLOBENZAPRINE HCL 5 MG PO TABS: 10.0000 mg | ORAL_TABLET | Freq: Once | ORAL | Status: DC | PRN
